# Patient Record
Sex: Male | Born: 1970 | Race: White | Hispanic: No | Marital: Married | Smoking: Never smoker
Health system: Southern US, Community
[De-identification: ages and names within clinical notes are randomized; demographics above are authoritative.]

## PROBLEM LIST (undated history)

## (undated) DIAGNOSIS — T7840XA Allergy, unspecified, initial encounter: Secondary | ICD-10-CM

## (undated) DIAGNOSIS — M1990 Unspecified osteoarthritis, unspecified site: Secondary | ICD-10-CM

## (undated) DIAGNOSIS — K219 Gastro-esophageal reflux disease without esophagitis: Secondary | ICD-10-CM

## (undated) HISTORY — DX: Allergy, unspecified, initial encounter: T78.40XA

## (undated) HISTORY — DX: Gastro-esophageal reflux disease without esophagitis: K21.9

## (undated) HISTORY — PX: OTHER SURGICAL HISTORY: SHX169

## (undated) HISTORY — DX: Unspecified osteoarthritis, unspecified site: M19.90

## (undated) HISTORY — PX: HERNIA REPAIR: SHX51

---

## 2013-08-22 ENCOUNTER — Encounter (HOSPITAL_COMMUNITY): Payer: Self-pay | Admitting: Emergency Medicine

## 2013-08-22 ENCOUNTER — Emergency Department (INDEPENDENT_AMBULATORY_CARE_PROVIDER_SITE_OTHER)
Admission: EM | Admit: 2013-08-22 | Discharge: 2013-08-22 | Disposition: A | Discharge status: Home / Self Care | Payer: Self-pay | Source: Home / Self Care | Attending: Family Medicine | Admitting: Family Medicine

## 2013-08-22 ENCOUNTER — Emergency Department (INDEPENDENT_AMBULATORY_CARE_PROVIDER_SITE_OTHER): Payer: Self-pay

## 2013-08-22 DIAGNOSIS — M19041 Primary osteoarthritis, right hand: Secondary | ICD-10-CM

## 2013-08-22 DIAGNOSIS — M19049 Primary osteoarthritis, unspecified hand: Secondary | ICD-10-CM

## 2013-08-22 DIAGNOSIS — M79609 Pain in unspecified limb: Secondary | ICD-10-CM

## 2013-08-22 DIAGNOSIS — M79641 Pain in right hand: Secondary | ICD-10-CM

## 2013-08-22 MED ORDER — METHYLPREDNISOLONE ACETATE 80 MG/ML IJ SUSP
INTRAMUSCULAR | Status: AC
  Filled 2013-08-22: qty 1

## 2013-08-22 MED ORDER — METHYLPREDNISOLONE ACETATE 40 MG/ML IJ SUSP
80.0000 mg | Freq: Once | INTRAMUSCULAR | Status: AC
  Administered 2013-08-22: 80 mg via INTRAMUSCULAR

## 2013-08-22 MED ORDER — TRIAMCINOLONE ACETONIDE 40 MG/ML IJ SUSP
INTRAMUSCULAR | Status: AC
  Filled 2013-08-22: qty 1

## 2013-08-22 MED ORDER — TRIAMCINOLONE ACETONIDE 40 MG/ML IJ SUSP
40.0000 mg | Freq: Once | INTRAMUSCULAR | Status: AC
  Administered 2013-08-22: 40 mg via INTRAMUSCULAR

## 2013-08-22 MED ORDER — MELOXICAM 15 MG PO TABS: 15.0000 mg | ORAL_TABLET | Freq: Every day | ORAL | Status: DC

## 2013-08-22 NOTE — ED Notes (Signed)
Pt reports pain on right hand onset 2 months Sx include swelling around MCP's and unable to make a fist due to pain Works in [employer] business; denies inj/truama Alert w/no signs of acute distress.

## 2013-08-22 NOTE — ED Provider Notes (Signed)
CSN: [id number]     Arrival date & time 08/22/13  [id number] History   First MD Initiated Contact with Patient 08/22/13 1020     Chief Complaint  Patient presents with  . Hand Pain   (Consider location/radiation/quality/duration/timing/severity/associated sxs/prior Treatment) HPI Comments: 43 year old male presents complaining of right hand pain. This pain initially began about 2 months ago and has gotten progressively worse. It has gotten much worse over the past week and has been associated with swelling around third  MCP joint. the pain is increased with any gripping activities, and he feels like he is having significant difficulty gripping with his third finger. 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 He denies any specific injuries. There is no numbness in the hand, or pain anywhere else. He has a family history of osteoarthritis.   History reviewed. No pertinent past medical history. History reviewed. No pertinent past surgical history. No family history on file. History  Substance Use Topics  . Smoking status: Never Smoker   . Smokeless tobacco: Not on file  . Alcohol Use: Yes    Review of Systems  Musculoskeletal:       Right hand pain   All other systems reviewed and are negative.   Allergies  Review of patient's allergies indicates no known allergies.  Home Medications   Prior to Admission medications   Medication Sig Start Date End Date Taking? Authorizing Provider  meloxicam (MOBIC) 15 MG tablet Take 1 tablet (15 mg total) by mouth daily. 08/22/13   Adrian BlackwaterZachary H Christinea Brizuela, PA-C   BP 178/105  Pulse 71  Temp(Src) 99.2 F (37.3 C) (Oral)  Resp 18  SpO2 99% Physical Exam  Nursing note and vitals reviewed. Constitutional: He is oriented to person, place, and time. He appears well-developed and well-nourished. No distress.  HENT:  Head: Normocephalic.   Cardiovascular:  Pulses:      Radial pulses are 2+ on the right side, and 2+ on the left side.  Pulmonary/Chest: Effort normal. No respiratory distress.  Musculoskeletal:       Right hand: He exhibits tenderness (minimal tenderness over distal third metacarpal), bony tenderness and swelling (Around the distal third metacarpal). He exhibits normal range of motion, normal two-point discrimination, normal capillary refill, no deformity and no laceration. Normal sensation noted. Normal strength noted.  Neurological: He is alert and oriented to person, place, and time. Coordination normal.  Skin: Skin is warm and dry. No rash noted. He is not diaphoretic.  Psychiatric: He has a normal mood and affect. Judgment normal.    ED Course  Procedures (including critical care time) Labs Review Labs Reviewed - No data to display  Imaging Review Dg Hand Complete Right  08/22/2013   CLINICAL DATA:  Hand pain for several weeks. History of repetitive motion, but no acute injury.  EXAM: RIGHT HAND - COMPLETE 3+ VIEW  COMPARISON:  None.  FINDINGS: There is no evidence of fracture or dislocation. There is no evidence of arthropathy or soft tissue swelling. Focal degenerative change at the fourth finger PIP joint with joint space narrowing and osseous spurring. This could represent an old injury.  IMPRESSION: No acute findings.  Fourth digit PIP joint degenerative change.   Electronically Signed   By: Davonna BellingJohn  Curnes M.D.   On: 08/22/2013 10:54     MDM   1. Pain of right hand   2.  Arthritis of right hand    X-rays negative. Given his level of activity with the hand, likely has some arthritis. Will give injection of steroids here and discharge with daily meloxicam, ice, and relative rest. Followup as needed   Meds ordered this encounter  Medications  . methylPREDNISolone acetate (DEPO-MEDROL) injection 80 mg    Sig:   . triamcinolone acetonide (KENALOG-40) injection 40 mg    Sig:   . meloxicam (MOBIC) 15  MG tablet    Sig: Take 1 tablet (15 mg total) by mouth daily.    Dispense:  30 tablet    Refill:  2    Order Specific Question:  Supervising Provider    Answer:  Bradd Canary D [[id number]]       Graylon Good, PA-C 08/22/13 1108

## 2013-08-22 NOTE — Discharge Instructions (Signed)
Arthritis, Nonspecific °Arthritis is pain, redness, warmth, or puffiness (inflammation) of a joint. The joint may be stiff or hurt when you move it. One or more joints may be affected. There are many types of arthritis. Your doctor may not know what type you have right away. The most common cause of arthritis is wear and tear on the joint (osteoarthritis). °HOME CARE  °· Only take medicine as told by your doctor. °· Rest the joint as much as possible. °· Raise (elevate) your joint if it is puffy. °· Use crutches if the painful joint is in your leg. °· Drink enough fluids to keep your pee (urine) clear or pale yellow. °· Follow your doctor's diet instructions. °· Use cold packs for very bad joint pain for 10 to 15 minutes every hour. Ask your doctor if it is okay for you to use hot packs. °· Exercise as told by your doctor. °· Take a warm shower if you have stiffness in the morning. °· Move your sore joints throughout the day. °GET HELP RIGHT AWAY IF:  °· You have a fever. °· You have very bad joint pain, puffiness, or redness. °· You have many joints that are painful and puffy. °· You are not getting better with treatment. °· You have very bad back pain or leg weakness. °· You cannot control when you poop (bowel movement) or pee (urinate). °· You do not feel better in 24 hours or are getting worse. °· You are having side effects from your medicine. °MAKE SURE YOU:  °· Understand these instructions. °· Will watch your condition. °· Will get help right away if you are not doing well or get worse. °Document Released: 03/21/2009 Document Revised: 06/26/2011 Document Reviewed: 03/21/2009 °ExitCare® Patient Information ©2015 [employer]. This information is not intended to replace advice given to you by your health care provider. Make sure you discuss any questions you have with your health care provider. ° °

## 2013-08-22 NOTE — ED Provider Notes (Signed)
Medical screening examination/treatment/procedure(s) were performed by resident physician or non-physician practitioner and as supervising physician I was immediately available for consultation/collaboration.   Rayah Fines DOUGLAS MD.   Semya Klinke D Cyriah Childrey, MD 08/22/13 1245 

## 2015-05-12 IMAGING — CR DG HAND COMPLETE 3+V*R*
3 series · 3 of 3 positions shown · non-contrast
Comparison: None.

CLINICAL DATA: Hand pain for several weeks. History of repetitive
motion, but no acute injury.

EXAM:
RIGHT HAND - COMPLETE 3+ VIEW

[view not recorded (1 of 3)]
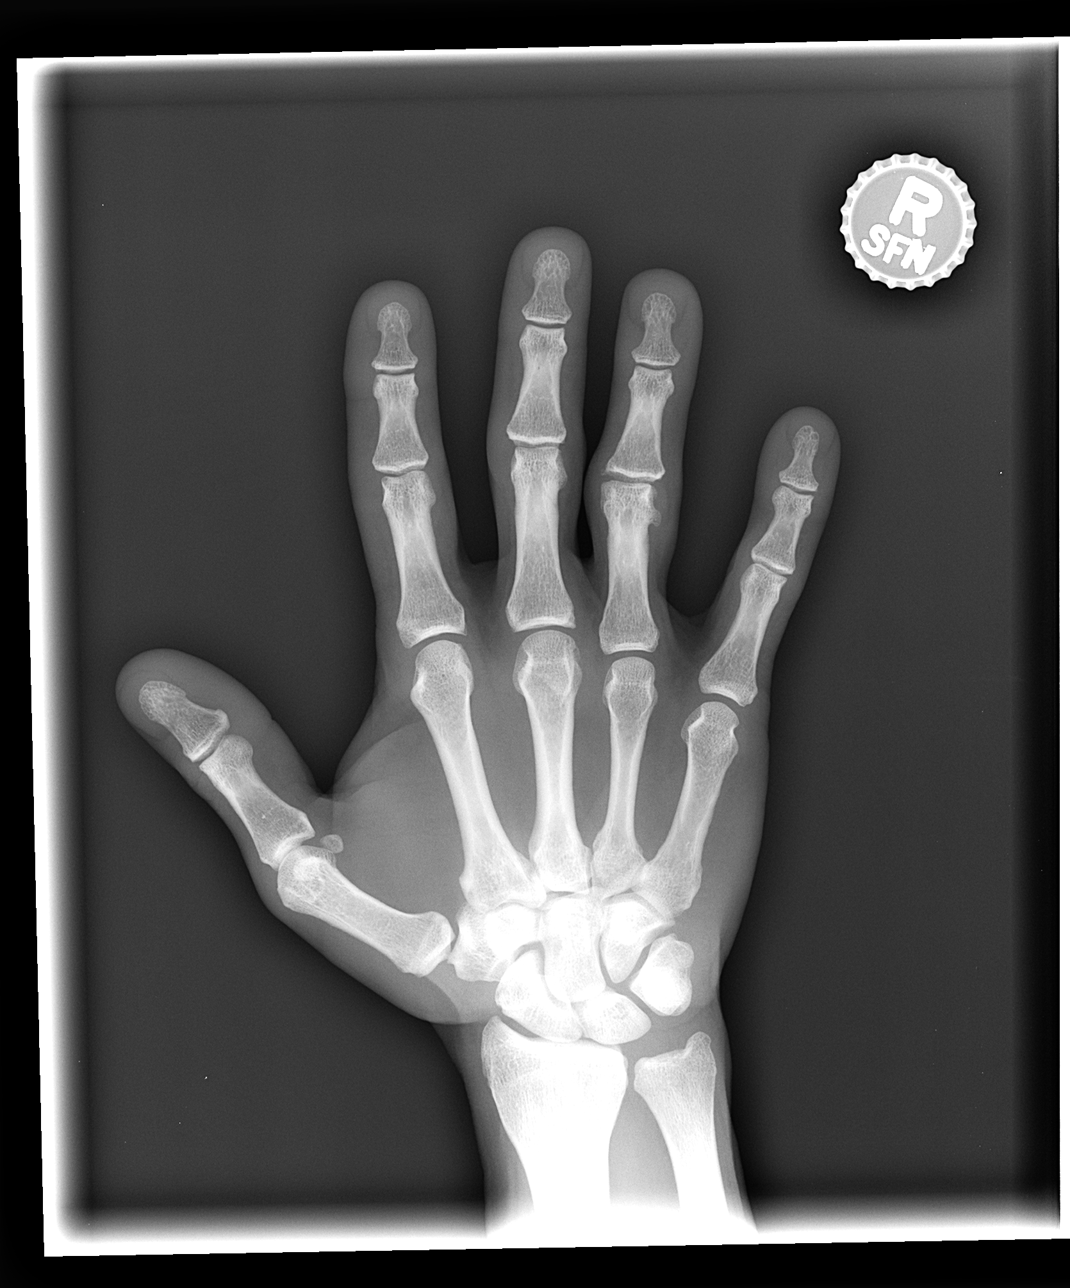

[view not recorded (2 of 3)]
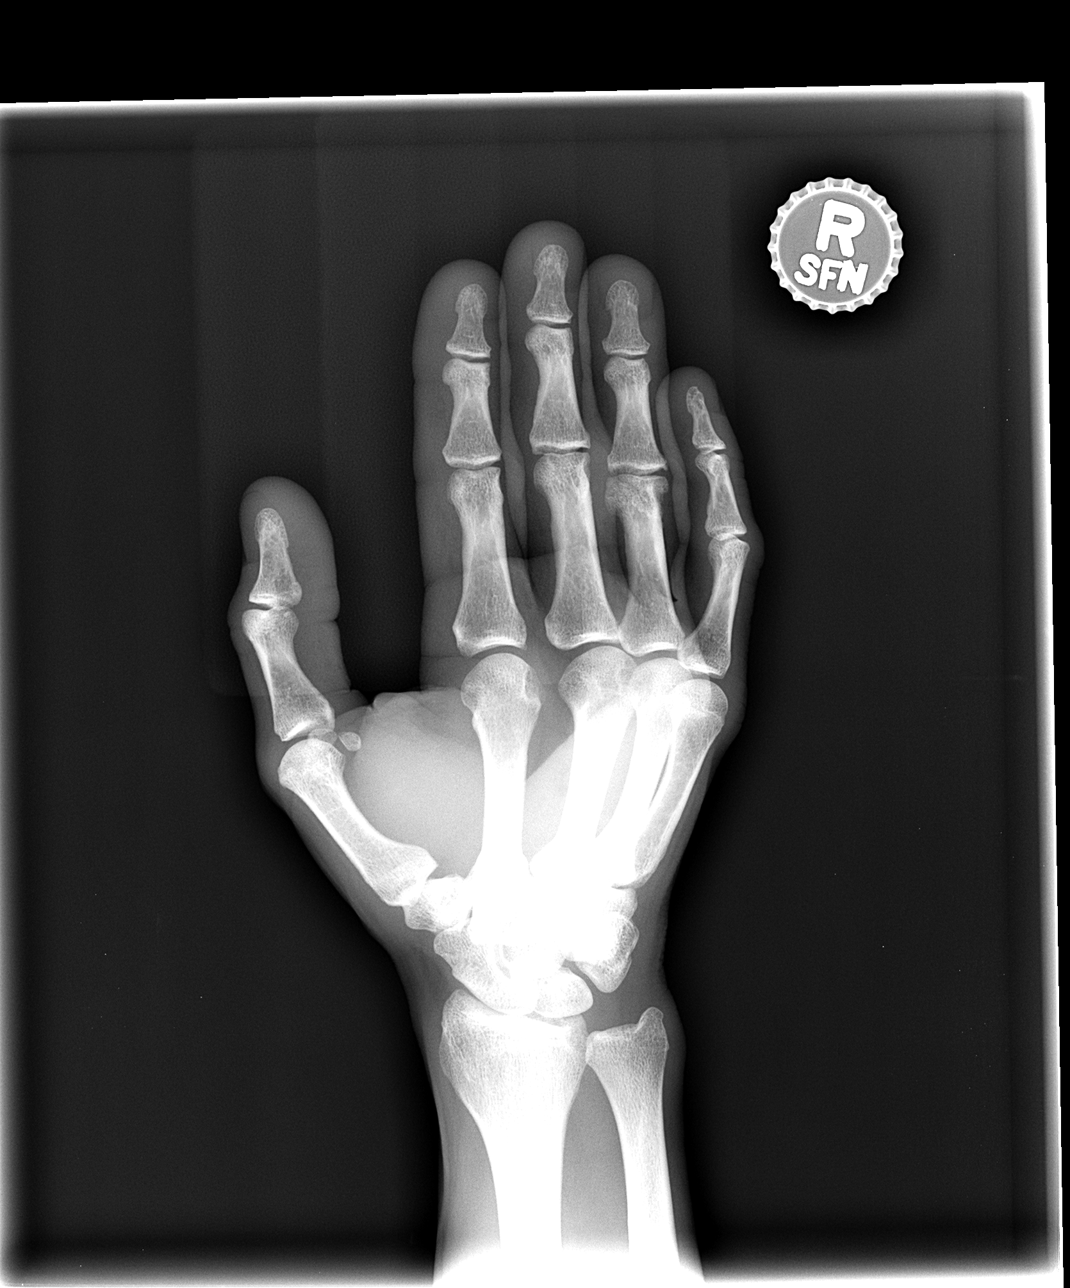

[view not recorded (3 of 3)]
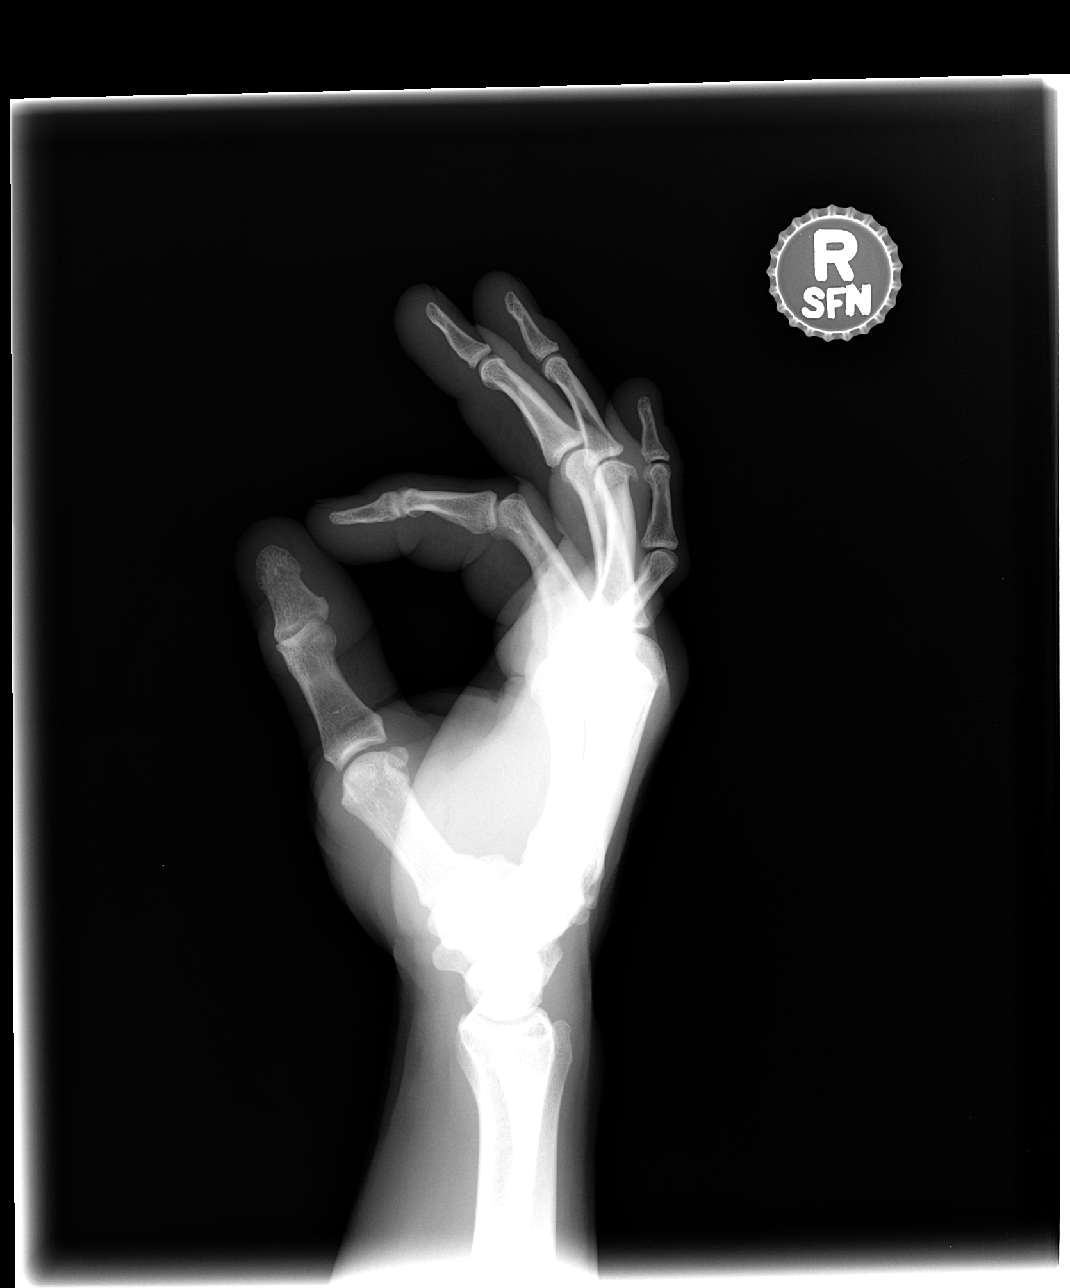

[3 of 3 positions shown; findings below may reference images not displayed]

FINDINGS: There is no evidence of fracture or dislocation. There is no
evidence of arthropathy or soft tissue swelling. Focal degenerative
change at the fourth finger PIP joint with joint space narrowing and
osseous spurring. This could represent an old injury.
IMPRESSION: No acute findings.  Fourth digit PIP joint degenerative change.

## 2019-06-20 ENCOUNTER — Ambulatory Visit: Payer: Self-pay | Attending: Internal Medicine

## 2019-06-20 DIAGNOSIS — Z23 Encounter for immunization: Secondary | ICD-10-CM

## 2019-06-20 NOTE — Progress Notes (Signed)
   Covid-19 Vaccination Clinic  Name:  Patrick Mills    MRN: [id number] DOB: Mar 19, 1970  06/20/2019  [name] was observed post Covid-19 immunization for 15 minutes without incident. He was provided with Vaccine Information Sheet and instruction to access the V-Safe system.   [name] was instructed to call 911 with any severe reactions post vaccine: Marland Kitchen Difficulty breathing  . Swelling of face and throat  . A fast heartbeat  . A bad rash all over body  . Dizziness and weakness   Immunizations Administered    Name Date Dose VIS Date Route   Pfizer COVID-19 Vaccine 06/20/2019 12:03 PM 0.3 mL 03/04/2018 Intramuscular   Manufacturer: [employer], [employer]   Lot: [id number]   NDC: [address]-0

## 2022-10-26 ENCOUNTER — Emergency Department (HOSPITAL_COMMUNITY): Payer: Self-pay

## 2022-10-26 ENCOUNTER — Other Ambulatory Visit: Payer: Self-pay

## 2022-10-26 ENCOUNTER — Encounter (HOSPITAL_COMMUNITY): Payer: Self-pay

## 2022-10-26 ENCOUNTER — Inpatient Hospital Stay (HOSPITAL_COMMUNITY)
Admission: EM | Admit: 2022-10-26 | Discharge: 2022-10-28 | DRG: 872 | Disposition: A | Discharge status: Home / Self Care | Payer: Self-pay | Attending: Family Medicine | Admitting: Family Medicine

## 2022-10-26 DIAGNOSIS — Z1152 Encounter for screening for COVID-19: Secondary | ICD-10-CM

## 2022-10-26 DIAGNOSIS — I447 Left bundle-branch block, unspecified: Secondary | ICD-10-CM | POA: Diagnosis present

## 2022-10-26 DIAGNOSIS — I10 Essential (primary) hypertension: Secondary | ICD-10-CM | POA: Insufficient documentation

## 2022-10-26 DIAGNOSIS — A419 Sepsis, unspecified organism: Principal | ICD-10-CM

## 2022-10-26 DIAGNOSIS — Z789 Other specified health status: Secondary | ICD-10-CM

## 2022-10-26 DIAGNOSIS — R17 Unspecified jaundice: Secondary | ICD-10-CM

## 2022-10-26 DIAGNOSIS — K5732 Diverticulitis of large intestine without perforation or abscess without bleeding: Secondary | ICD-10-CM | POA: Diagnosis present

## 2022-10-26 DIAGNOSIS — M1990 Unspecified osteoarthritis, unspecified site: Secondary | ICD-10-CM | POA: Diagnosis present

## 2022-10-26 DIAGNOSIS — F1090 Alcohol use, unspecified, uncomplicated: Secondary | ICD-10-CM

## 2022-10-26 DIAGNOSIS — K5792 Diverticulitis of intestine, part unspecified, without perforation or abscess without bleeding: Principal | ICD-10-CM

## 2022-10-26 DIAGNOSIS — Z886 Allergy status to analgesic agent status: Secondary | ICD-10-CM

## 2022-10-26 DIAGNOSIS — Z833 Family history of diabetes mellitus: Secondary | ICD-10-CM

## 2022-10-26 DIAGNOSIS — E860 Dehydration: Secondary | ICD-10-CM | POA: Insufficient documentation

## 2022-10-26 DIAGNOSIS — Z800 Family history of malignant neoplasm of digestive organs: Secondary | ICD-10-CM

## 2022-10-26 DIAGNOSIS — R6520 Severe sepsis without septic shock: Secondary | ICD-10-CM | POA: Diagnosis present

## 2022-10-26 DIAGNOSIS — E872 Acidosis, unspecified: Secondary | ICD-10-CM | POA: Diagnosis present

## 2022-10-26 HISTORY — DX: Other specified health status: Z78.9

## 2022-10-26 HISTORY — DX: Diverticulitis of large intestine without perforation or abscess without bleeding: K57.32

## 2022-10-26 HISTORY — DX: Alcohol use, unspecified, uncomplicated: F10.90

## 2022-10-26 LAB — URINALYSIS, ROUTINE W REFLEX MICROSCOPIC
Bilirubin Urine: NEGATIVE
Glucose, UA: NEGATIVE mg/dL
Hgb urine dipstick: NEGATIVE
Ketones, ur: 5 mg/dL — AB
Leukocytes,Ua: NEGATIVE
Nitrite: NEGATIVE
Protein, ur: NEGATIVE mg/dL
Specific Gravity, Urine: 1.042 — ABNORMAL HIGH (ref 1.005–1.030)
pH: 6 (ref 5.0–8.0)

## 2022-10-26 LAB — CBC
HCT: 44.9 % (ref 39.0–52.0)
Hemoglobin: 16 g/dL (ref 13.0–17.0)
MCH: 35 pg — ABNORMAL HIGH (ref 26.0–34.0)
MCHC: 35.6 g/dL (ref 30.0–36.0)
MCV: 98.2 fL (ref 80.0–100.0)
Platelets: 230 10*3/uL (ref 150–400)
RBC: 4.57 MIL/uL (ref 4.22–5.81)
RDW: 11.8 % (ref 11.5–15.5)
WBC: 18.6 10*3/uL — ABNORMAL HIGH (ref 4.0–10.5)
nRBC: 0 % (ref 0.0–0.2)

## 2022-10-26 LAB — COMPREHENSIVE METABOLIC PANEL
ALT: 46 U/L — ABNORMAL HIGH (ref 0–44)
AST: 46 U/L — ABNORMAL HIGH (ref 15–41)
Albumin: 4.8 g/dL (ref 3.5–5.0)
Alkaline Phosphatase: 63 U/L (ref 38–126)
Anion gap: 17 — ABNORMAL HIGH (ref 5–15)
BUN: 12 mg/dL (ref 6–20)
CO2: 20 mmol/L — ABNORMAL LOW (ref 22–32)
Calcium: 10.1 mg/dL (ref 8.9–10.3)
Chloride: 102 mmol/L (ref 98–111)
Creatinine, Ser: 1 mg/dL (ref 0.61–1.24)
GFR, Estimated: >60 mL/min (ref >60)
Glucose, Bld: 157 mg/dL — ABNORMAL HIGH (ref 70–99)
Potassium: 4.3 mmol/L (ref 3.5–5.1)
Sodium: 139 mmol/L (ref 135–145)
Total Bilirubin: 1.5 mg/dL — ABNORMAL HIGH (ref 0.3–1.2)
Total Protein: 7.9 g/dL (ref 6.5–8.1)

## 2022-10-26 LAB — LIPASE, BLOOD: Lipase: 23 U/L (ref 11–51)

## 2022-10-26 LAB — HIV ANTIBODY (ROUTINE TESTING W REFLEX): HIV Screen 4th Generation wRfx: NONREACTIVE

## 2022-10-26 LAB — PROTIME-INR
INR: 0.9 (ref 0.8–1.2)
Prothrombin Time: 12.8 s (ref 11.4–15.2)

## 2022-10-26 LAB — RESP PANEL BY RT-PCR (RSV, FLU A&B, COVID)  RVPGX2
Influenza A by PCR: NEGATIVE
Influenza B by PCR: NEGATIVE
Resp Syncytial Virus by PCR: NEGATIVE
SARS Coronavirus 2 by RT PCR: NEGATIVE

## 2022-10-26 LAB — I-STAT CG4 LACTIC ACID, ED
Lactic Acid, Venous: 5.2 mmol/L (ref 0.5–1.9)
Lactic Acid, Venous: 1.8 mmol/L (ref 0.5–1.9)

## 2022-10-26 MED ORDER — IOHEXOL 350 MG/ML SOLN
75.0000 mL | Freq: Once | INTRAVENOUS | Status: AC | PRN
  Administered 2022-10-26: 75 mL via INTRAVENOUS

## 2022-10-26 MED ORDER — ONDANSETRON HCL 4 MG/2ML IJ SOLN
4.0000 mg | Freq: Once | INTRAMUSCULAR | Status: AC
  Administered 2022-10-26: 4 mg via INTRAVENOUS
  Filled 2022-10-26: qty 2

## 2022-10-26 MED ORDER — HYDROMORPHONE HCL 1 MG/ML IJ SOLN
1.0000 mg | Freq: Once | INTRAMUSCULAR | Status: AC
  Administered 2022-10-26: 1 mg via INTRAVENOUS
  Filled 2022-10-26: qty 1

## 2022-10-26 MED ORDER — THIAMINE HCL 100 MG/ML IJ SOLN: 100.0000 mg | Freq: Every day | INTRAMUSCULAR | Status: DC

## 2022-10-26 MED ORDER — PIPERACILLIN-TAZOBACTAM 3.375 G IVPB
3.3750 g | Freq: Three times a day (TID) | INTRAVENOUS | Status: DC
  Administered 2022-10-26 – 2022-10-28 (×5): 3.375 g via INTRAVENOUS
  Filled 2022-10-26 (×4): qty 50

## 2022-10-26 MED ORDER — HYDROMORPHONE HCL 1 MG/ML IJ SOLN
1.0000 mg | INTRAMUSCULAR | Status: DC | PRN
  Administered 2022-10-26: 1 mg via INTRAVENOUS
  Filled 2022-10-26: qty 1

## 2022-10-26 MED ORDER — ONDANSETRON 4 MG PO TBDP
4.0000 mg | ORAL_TABLET | Freq: Three times a day (TID) | ORAL | Status: DC | PRN
  Administered 2022-10-27: 4 mg via ORAL
  Filled 2022-10-26: qty 1

## 2022-10-26 MED ORDER — ONDANSETRON HCL 4 MG/2ML IJ SOLN
4.0000 mg | Freq: Once | INTRAMUSCULAR | Status: AC | PRN
  Administered 2022-10-26: 4 mg via INTRAVENOUS
  Filled 2022-10-26: qty 2

## 2022-10-26 MED ORDER — KETOROLAC TROMETHAMINE 15 MG/ML IJ SOLN
15.0000 mg | Freq: Four times a day (QID) | INTRAMUSCULAR | Status: DC | PRN
  Administered 2022-10-26 – 2022-10-28 (×5): 15 mg via INTRAVENOUS
  Filled 2022-10-26 (×5): qty 1

## 2022-10-26 MED ORDER — THIAMINE MONONITRATE 100 MG PO TABS
100.0000 mg | ORAL_TABLET | Freq: Every day | ORAL | Status: DC
  Administered 2022-10-27 – 2022-10-28 (×2): 100 mg via ORAL
  Filled 2022-10-26 (×2): qty 1

## 2022-10-26 MED ORDER — ENOXAPARIN SODIUM 40 MG/0.4ML IJ SOSY
40.0000 mg | PREFILLED_SYRINGE | INTRAMUSCULAR | Status: DC
  Administered 2022-10-26 – 2022-10-27 (×2): 40 mg via SUBCUTANEOUS
  Filled 2022-10-26 (×2): qty 0.4

## 2022-10-26 MED ORDER — ADULT MULTIVITAMIN W/MINERALS CH
1.0000 | ORAL_TABLET | Freq: Every day | ORAL | Status: DC
  Administered 2022-10-27 – 2022-10-28 (×2): 1 via ORAL
  Filled 2022-10-26 (×2): qty 1

## 2022-10-26 MED ORDER — METRONIDAZOLE 500 MG/100ML IV SOLN
500.0000 mg | Freq: Once | INTRAVENOUS | Status: AC
  Administered 2022-10-26: 500 mg via INTRAVENOUS
  Filled 2022-10-26: qty 100

## 2022-10-26 MED ORDER — LACTATED RINGERS IV SOLN: INTRAVENOUS | Status: AC

## 2022-10-26 MED ORDER — SODIUM CHLORIDE 0.9 % IV SOLN
2.0000 g | Freq: Once | INTRAVENOUS | Status: AC
  Administered 2022-10-26: 2 g via INTRAVENOUS
  Filled 2022-10-26: qty 12.5

## 2022-10-26 MED ORDER — FOLIC ACID 1 MG PO TABS
1.0000 mg | ORAL_TABLET | Freq: Every day | ORAL | Status: DC
  Administered 2022-10-27 – 2022-10-28 (×2): 1 mg via ORAL
  Filled 2022-10-26 (×2): qty 1

## 2022-10-26 MED ORDER — ACETAMINOPHEN 10 MG/ML IV SOLN
1000.0000 mg | Freq: Four times a day (QID) | INTRAVENOUS | Status: DC
  Administered 2022-10-26 – 2022-10-27 (×3): 1000 mg via INTRAVENOUS
  Filled 2022-10-26 (×4): qty 100

## 2022-10-26 NOTE — H&P (Addendum)
Hospital Admission History and Physical Service Pager: [phone]  Patient name: Patrick Mills Medical record number: [id number] Date of Birth: 18-Jun-1970 Age: 52 y.o. Gender: male  Primary Care Provider: Patient, No Pcp Per Consultants: General surgery Code Status: FULL Preferred Emergency Contact:  Contact Information     Name Relation Home Work Mobile   Barile,Suzanne Spouse [phone]        Other Contacts   None on File    Chief Complaint: abdominal pain  Assessment and Plan: Patrick Mills is a 52 y.o. male presenting with abdominal pain . Differential for this patient's presentation of this includes diverticulitis, AAA, appendicitis, cholecystitis, pancreatitis, atypical ACS.  Diverticulitis is the leading diagnosis due to evidence of this on imaging, leukocytosis, and guarding on EDP physical exam. No evidence of appendicitis, abdominal aortic aneurysm, pancreatitis on CT scan, and pt is hemodynamically stable with normal lipase. Atypical ACS is less likely given lack of chest pain and normal EKG.  Assessment & Plan Cecal diverticulitis Patient is HDS and improved s/p fluids, pain medication, and cefepime/flagyl in the ED. Given severity of presentation, will require admission for IV antibiotics, fluids, and pain control. Surgery evaluated and recommended medical intervention for now. -- Admit to FMTS, progressive care, attending Dr. McDiarmid -- Continue IV ABX with Zosyn -- Continue LR 150mL/hr --- Remain n.p.o. other than ice chips -- IV Tylenol 400 mL/h q6h -- Toradol 15 mg q6h PRN for moderate pain -- Dilaudid 1 mg q4 PRN for severe pain -- Zofran 4 mg q8 PRN for nausea/vomiting --- AM CBC, CMP -- Monitor vitals per unit protocol -- GS recommend outpatient colonoscopy in 6 to 8 weeks Sepsis without acute organ dysfunction, due to unspecified organism [facility]) Due to diverticulitis as above. Meets sepsis criteria with tachycardia, leukocytosis, and initially  elevated lactic acid with suspected gastrointestinal source of infection. Exam and LA much improved with interventions. -- Continue IV ABX and fluids as above -- Follow-up blood cultures Alcohol use Patient reports daily alcohol use x 20 to 30 years.  Denies history of withdrawal, intubation, ICU admission. -- CIWA protocol without ativan unless needed --Thiamine, folate, multi vitamin supplementation -- TOC consult for substance use  FEN/GI: NPO, IVF 150 mL/hr VTE Prophylaxis: Lovenox  Disposition: Progressive  History of Present Illness:  Patrick Mills is a 52 y.o. male presenting with severe abdominal pain.  Yesterday, he had abdominal cramping at work. He worked through it. At 4 AM this morning, he could not hardly walk due to excruitiating pain that woke him up. He tried going to work but was unable to [employer] his shift by 8 AM due to vomiting, diarrhea, and continued worsening pain. No blood in stool or emesis. He was doubled over. He also pulled a muscle in his back last week that he feels is also contributing to lower abdominal pain.  He has not been able to eat anything today. No issues with constipation in the past; he is usually regular. He has a balanced diet. He feels dehydrated and has not drank enough recently. No known fevers, but he was having chills when he first came to the ED. Since coming into the ED, he feels better. No other recent illnesses. This has never happened before. He does not have a PCP, and he has never had a colonoscopy.  In the ED, presented with abdominal pain and was found to be peritonitic and tachycardic on physical exam.  Labs are significant for leukocytosis to 10.6 and lactic acid  to 5.2 which improved to 1.8.  CT abdomen pelvis showed cecal diverticulitis.  Got Dilaudid 1 mg for pain, Zofran 4 mg for nausea/vomiting, and was started on IV cefepime, Flagyl, fluids and made NPO.  Surgery consulted and said no need for emergent surgery at this  time.  Review Of Systems: Per HPI.  Pertinent Past Medical History: None Remainder reviewed in history tab.   Pertinent Past Surgical History: Inguinal hernia surgery on R in 1989  Remainder reviewed in history tab.   Pertinent Social History: Tobacco use: None now, <1 ppd for 15 years but quit 15-20 years ago Alcohol use: 2-4 beers every day for 20-30 years, last drink night of 10/17 at [employer] Other Substance use: smokes marijuana daily Lives with Rosalita Chessman, his wife  Pertinent Family History: Grandfather: colon cancer in mid-60s Uncle: diabetes Remainder reviewed in history tab.   Important Outpatient Medications: Takes ibuprofen prn for arthritic pain Remainder reviewed in medication history.   Objective: BP (!) 147/91   Pulse (!) 103   Temp 98.4 F (36.9 C) (Oral)   Resp (!) 120   Ht 5\' 7"  (1.702 m)   Wt 68 kg   SpO2 96%   BMI 23.49 kg/m  Exam: General: Lying in bed, conversant, in NAD Eyes: EOM grossly intact, PERRLA ENTM: Bilateral conjunctiva not injected, dry mucous membranes, good dentition Cardiovascular: Tachycardic, regular rhythm, normal S1/S2, no murmurs, rubs, gallops Respiratory: CTAB, normal WOB on RA, no wheezes, rales, rhonchi Gastrointestinal: Normoactive bowel sounds, moderate tenderness to palpation across lower abdomen to deep palpation without rebound or guarding, nondistended Derm: No obvious rashes or lesions Neuro: Alert and oriented, moving all extremities spontaneously Psych: Appropriate mood and affect  Labs:  CBC BMET  Recent Labs  Lab 10/26/22 1027  WBC 18.6*  HGB 16.0  HCT 44.9  PLT 230   Recent Labs  Lab 10/26/22 1027  NA 139  K 4.3  CL 102  CO2 20*  BUN 12  CREATININE 1.00  GLUCOSE 157*  CALCIUM 10.1    Lactic acid: 5.2 > 1.8 Lipase 23  EKG: Normal sinus rhythm, no ST elevations or LBBB  Imaging Studies Performed:  CTAP 1. Findings are most consistent with cecal diverticulitis. There  is surrounding inflammation and a small amount of fluid in the right pericolic gutter, but no evidence of bowel perforation or abscess. The appendix and terminal ileum appear normal. 2. No other acute findings. 3.  Aortic Atherosclerosis (ICD10-I70.0).  Lorayne Bender, MD 10/26/2022, 2:19 PM PGY-1, [facility] FPTS Intern pager: [phone], text pages welcome Secure chat group [facility] Teaching Service   I was personally present and performed or re-performed the history, physical exam and medical decision making activities of this service and have verified that the service and findings are accurately documented in the resident's note.  Janeal Holmes, MD                  10/26/2022, 3:59 PM PGY-2, [facility]

## 2022-10-26 NOTE — Plan of Care (Signed)
CHL Tonsillectomy/Adenoidectomy, Postoperative PEDS care plan entered in error.

## 2022-10-26 NOTE — Assessment & Plan Note (Addendum)
Due to diverticulitis as above. Meets sepsis criteria with tachycardia, leukocytosis, and initially elevated lactic acid with suspected gastrointestinal source of infection. Exam and LA much improved with interventions. -- Continue IV ABX and fluids as above -- Follow-up blood cultures

## 2022-10-26 NOTE — Consult Note (Signed)
[facility] Mills 03-May-1970  [id number].    Requesting MD: Dr. Derwood Kaplan Chief Complaint/Reason for Consult: RLQ abdominal pain  HPI:  This is a 52 yo otherwise healthy male who began having some crampy abdominal pain yesterday throughout the day.  He was able to tolerate a diet yesterday, but then developed some nausea and vomiting.  He has subsequent developed diarrhea as well.  This was nonbloody as was his emesis.  No further diarrhea since getting to the hospital.  This morning around 0400am he developed acutely worsening pain on the right side of his lower abdomen.  He denies any fevers.  His pain was so significant that his wife brought him to the [facility].  He has been found to have a lactic acid of 5.2, WBC of 18K, but AF, borderline tachy at 100, and a CT scan showing cecal diverticulitis with no complications such as perforation, abscess, etc.  The appendix and TI are normal.  The patient has never had a colonoscopy.  We have been asked to see him.  ROS: ROS: Please see HPI, otherwise negative, except he has only voided once today.  Denies pneumaturia, dysuria, SOB, CP, etc  History reviewed. No pertinent family history.  History reviewed. No pertinent past medical history.  Past Surgical History:  Procedure Laterality Date   right inguinal hernia repair      Social History:  reports that he has never smoked. He has never used smokeless tobacco. He reports current alcohol use of about 28.0 standard drinks of alcohol per week. He reports current drug use. Drug: Marijuana.  Allergies:  Allergies  Allergen Reactions   Meloxicam Hives    (Not in a hospital admission)    Physical Exam: Blood pressure (!) 147/91, pulse (!) 103, temperature 98.4 F (36.9 C), temperature source Oral, resp. rate (!) 120, height 5\' 7"  (1.702 m), weight 68 kg, SpO2 96%. General: pleasant, WD, WN white male who is laying in bed in NAD HEENT: head is normocephalic, atraumatic.  Sclera are  noninjected.  PERRL.  Ears and nose without any masses or lesions.  Mouth is pink and moist Heart: regular, rate, and rhythm.  Normal s1,s2. No obvious murmurs, gallops, or rubs noted.  Palpable radial and pedal pulses bilaterally Lungs: CTAB, no wheezes, rhonchi, or rales noted.  Respiratory effort nonlabored Abd: soft, tender on the right side of the abdomen, greatest focally in the RLQ with mild guarding, but no peritonitis, ND, +BS, no masses or organomegaly, very tiny reducible umbilical hernia. MS: all 4 extremities are symmetrical with no cyanosis, clubbing, or edema. Psych: A&Ox3 with an appropriate affect.   Results for orders placed or performed during the hospital encounter of 10/26/22 (from the past 48 hour(s))  Lipase, blood     Status: None   Collection Time: 10/26/22 10:27 AM  Result Value Ref Range   Lipase 23 11 - 51 U/L    Comment: Performed at [facility], [address] [place], [place] [address]  Comprehensive metabolic panel     Status: Abnormal   Collection Time: 10/26/22 10:27 AM  Result Value Ref Range   Sodium 139 135 - 145 mmol/L   Potassium 4.3 3.5 - 5.1 mmol/L   Chloride 102 98 - 111 mmol/L   CO2 20 (L) 22 - 32 mmol/L   Glucose, Bld 157 (H) 70 - 99 mg/dL    Comment: Glucose reference range applies only to samples taken after fasting for at least 8 hours.  BUN 12 6 - 20 mg/dL   Creatinine, Ser 1.61 0.61 - 1.24 mg/dL   Calcium 09.6 8.9 - 04.5 mg/dL   Total Protein 7.9 6.5 - 8.1 g/dL   Albumin 4.8 3.5 - 5.0 g/dL   AST 46 (H) 15 - 41 U/L   ALT 46 (H) 0 - 44 U/L   Alkaline Phosphatase 63 38 - 126 U/L   Total Bilirubin 1.5 (H) 0.3 - 1.2 mg/dL   GFR, Estimated >40 >98 mL/min    Comment: (NOTE) Calculated using the CKD-EPI Creatinine Equation (2021)    Anion gap 17 (H) 5 - 15    Comment: Performed at [facility], [address], [place], [place] [address]  CBC     Status: Abnormal   Collection Time: 10/26/22 10:27 AM  Result Value Ref Range    WBC 18.6 (H) 4.0 - 10.5 K/uL   RBC 4.57 4.22 - 5.81 MIL/uL   Hemoglobin 16.0 13.0 - 17.0 g/dL   HCT 78.2 95.6 - 21.3 %   MCV 98.2 80.0 - 100.0 fL   MCH 35.0 (H) 26.0 - 34.0 pg   MCHC 35.6 30.0 - 36.0 g/dL   RDW 08.6 57.8 - 46.9 %   Platelets 230 150 - 400 K/uL   nRBC 0.0 0.0 - 0.2 %    Comment: Performed at [facility], [address], [place], [place] [address]  I-Stat Lactic Acid, ED     Status: Abnormal   Collection Time: 10/26/22  1:25 PM  Result Value Ref Range   Lactic Acid, Venous 5.2 (HH) 0.5 - 1.9 mmol/L   Comment NOTIFIED PHYSICIAN   Protime-INR     Status: None   Collection Time: 10/26/22  1:25 PM  Result Value Ref Range   Prothrombin Time 12.8 11.4 - 15.2 seconds   INR 0.9 0.8 - 1.2    Comment: (NOTE) INR goal varies based on device and disease states. Performed at [facility], [address], [place], [place] [address]   I-Stat Lactic Acid, ED     Status: None   Collection Time: 10/26/22  1:50 PM  Result Value Ref Range   Lactic Acid, Venous 1.8 0.5 - 1.9 mmol/L   CT ABDOMEN PELVIS W CONTRAST  Result Date: 10/26/2022 CLINICAL DATA:  Abdominal pain. Peritonitis or perforation suspected. EXAM: CT ABDOMEN AND PELVIS WITH CONTRAST TECHNIQUE: Multidetector CT imaging of the abdomen and pelvis was performed using the standard protocol following bolus administration of intravenous contrast. RADIATION DOSE REDUCTION: This exam was performed according to the departmental dose-optimization program which includes automated exposure control, adjustment of the mA and/or kV according to patient size and/or use of iterative reconstruction technique. CONTRAST:  75mL OMNIPAQUE IOHEXOL 350 MG/ML SOLN COMPARISON:  None Available. FINDINGS: Lower chest: Clear lung bases. No significant pleural or pericardial effusion. Hepatobiliary: The liver is normal in density without suspicious focal abnormality. No evidence of gallstones, gallbladder wall thickening or biliary  dilatation. Pancreas: Unremarkable. No pancreatic ductal dilatation or surrounding inflammatory changes. Spleen: Normal in size without focal abnormality. Adrenals/Urinary Tract: Both adrenal glands appear normal. No evidence of urinary tract calculus, suspicious renal lesion or hydronephrosis. The bladder appears unremarkable for its degree of distention. Stomach/Bowel: No enteric contrast administered. The stomach appears unremarkable for its degree of distention. No small bowel distension, wall thickening or surrounding inflammation identified. The terminal ileum and appendix appear normal. However, there is wall thickening of the medial cecum with surrounding inflammation and a  probable inflamed diverticulum (axial images 48 and 49 of series 3), consistent with diverticulitis. There is a small amount of fluid in the right pericolic gutter, but no focal extraluminal fluid collection or extraluminal gas. More distally, the colon is decompressed without additional inflammation. Mild sigmoid diverticulosis. Vascular/Lymphatic: There are no enlarged abdominal or pelvic lymph nodes. No acute vascular findings. Mild aortoiliac atherosclerosis. 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 Reproductive: The prostate gland and seminal vesicles appear unremarkable. Other: No evidence of abdominal wall mass or hernia. No ascites or pneumoperitoneum. Musculoskeletal: No acute or significant osseous findings. Unless specific follow-up recommendations are mentioned in the findings or impression sections, no imaging follow-up of any mentioned incidental findings is recommended. IMPRESSION: 1. Findings are most consistent with cecal diverticulitis. There is surrounding inflammation and a small amount of fluid in the right pericolic gutter, but no evidence of bowel perforation or abscess. The appendix and terminal ileum appear normal. 2. No other acute findings. 3.  Aortic Atherosclerosis (ICD10-I70.0). Electronically  Signed   By: Carey Bullocks M.D.   On: 10/26/2022 13:47      Assessment/Plan Cecal diverticulitis The patient has been seen, examined, labs, vitals, imaging, and chart reviewed.  I have personally discussed this case with the EDP as well as on the phone with radiology.  The patient's appendix appears normal. His cecum is diffusely inflamed with evidence of diverticulum, suggesting cecal diverticulitis.  There is a small amount of pericolic gutter fluid/stranding, but no evidence of perforation or abscess.  The patient's lactic acid is 5.2 and suspect this is more secondary to dehydration as opposed to sepsis given he has only voided once and how his CT scan appears.  He does not need an emergency operation.  He has essentially uncomplicated diverticulitis.  We would recommend medical admission with IV abx therapy and bowel rest.  He will need outpatient GI follow up for a colonoscopy in 6-8 weeks.  We did discuss that if he acutely worsens during his stay that could result in repeat imaging vs even the need for surgery if he were to acutely worsen.  He and his wife, who is at bedside, understand.  All questions were answered.  I did discuss pathophysiology of diverticulitis with them.   FEN - NPO/IVF resuscitation VTE - ok for chemical prophylaxis from our standpoint ID - currently getting cefepime/flagyl x1 dose in ED, could continue this vs Rocephin/Flagyl given first episode and uncomplicated nature, vs zosyn as well Admit - per medicine  Daily marijuana  Daily ETOH - 4 beers/day  I reviewed ED provider notes, last 24 h vitals and pain scores, last 48 h intake and output, last 24 h labs and trends, and last 24 h imaging results d/w with EDP in person and radiology on the phone.  Letha Cape, [facility] Surgery 10/26/2022, 2:20 PM Please see Amion for pager number during day hours 7:00am-4:30pm or 7:00am -11:30am on weekends

## 2022-10-26 NOTE — ED Notes (Signed)
This Clinical research associate did not see orders for blood cultures until after initiating antibiotics. However both antibiotics were paused in order to draw cultures prior to antibiotics being given and neither were actually administered before cultures drawn.

## 2022-10-26 NOTE — Progress Notes (Signed)
ED Pharmacy Antibiotic Sign Off An antibiotic consult was received from an ED provider for cefepime per pharmacy dosing for intra-abdominal infection. A chart review was completed to assess appropriateness.   The following one time order(s) were placed:  Cefepime 2G IV x1 Flagyl 500mg  IV x1  Further antibiotic and/or antibiotic pharmacy consults should be ordered by the admitting provider if indicated.   Thank you for allowing pharmacy to be a part of this patient's care.   Smitty Cords, [facility]  Clinical Pharmacist 10/26/22 11:36 AM

## 2022-10-26 NOTE — ED Provider Notes (Signed)
[facility] EMERGENCY DEPARTMENT AT [facility] Provider Note   CSN: [id number] Arrival date & time: 10/26/22  1012     History  Chief Complaint  Patient presents with   Abdominal Pain    Patrick Mills is a 52 y.o. male.  HPI    52 year old male comes in with chief complaint of abdominal pain.  Patient started noticing some cramping abdominal pain yesterday morning.  He felt off, but was able to focus through work, and pain was only intermittently bothering him.  However, he woke up in the middle of the night with severe abdominal pain.  Pain is located in the upper quadrants, below his ribs.  Pain is described as sharp, with tightness to his back.  Review of system also positive for nausea with emesis x 3 and 3 loose bowel movements today.  Patient has some chills and he had sweats with 1 episode of vomiting.  There was no blood or bile in the vomitus and the stool was clear, loose and nonbloody.  Patient has history of hernia repair several years ago.  He admits to regular consumption of 2-4 beers.  Home Medications Prior to Admission medications   Not on File      Allergies    Meloxicam    Review of Systems   Review of Systems  All other systems reviewed and are negative.   Physical Exam Updated Vital Signs BP (!) 147/91   Pulse (!) 103   Temp 98.4 F (36.9 C) (Oral)   Resp (!) 120   Ht 5\' 7"  (1.702 m)   Wt 68 kg   SpO2 96%   BMI 23.49 kg/m  Physical Exam Vitals and nursing note reviewed.  Constitutional:      General: He is in acute distress.     Appearance: He is well-developed.     Comments: Distress secondary to pain  HENT:     Head: Atraumatic.  Cardiovascular:     Rate and Rhythm: Normal rate.  Pulmonary:     Effort: Pulmonary effort is normal.  Abdominal:     Tenderness: There is generalized abdominal tenderness and tenderness in the right lower quadrant, suprapubic area and left lower quadrant. There is guarding and rebound. Positive  signs include McBurney's sign.  Musculoskeletal:     Cervical back: Neck supple.  Skin:    General: Skin is warm.  Neurological:     Mental Status: He is alert and oriented to person, place, and time.     ED Results / Procedures / Treatments   Labs (all labs ordered are listed, but only abnormal results are displayed) Labs Reviewed  COMPREHENSIVE METABOLIC PANEL - Abnormal; Notable for the following components:      Result Value   CO2 20 (*)    Glucose, Bld 157 (*)    AST 46 (*)    ALT 46 (*)    Total Bilirubin 1.5 (*)    Anion gap 17 (*)    All other components within normal limits  CBC - Abnormal; Notable for the following components:   WBC 18.6 (*)    MCH 35.0 (*)    All other components within normal limits  I-STAT CG4 LACTIC ACID, ED - Abnormal; Notable for the following components:   Lactic Acid, Venous 5.2 (*)    All other components within normal limits  RESP PANEL BY RT-PCR (RSV, FLU A&B, COVID)  RVPGX2  CULTURE, BLOOD (ROUTINE X 2)  CULTURE, BLOOD (ROUTINE X 2)  LIPASE, BLOOD  PROTIME-INR  URINALYSIS, ROUTINE W REFLEX MICROSCOPIC  HIV ANTIBODY (ROUTINE TESTING W REFLEX)  I-STAT CG4 LACTIC ACID, ED    EKG None  Radiology DG Chest Port 1 View  Result Date: 10/26/2022 CLINICAL DATA:  Provided history: Questionable sepsis-evaluate for abnormality. EXAM: PORTABLE CHEST 1 VIEW COMPARISON:  None. FINDINGS: Cardiac monitoring leads overlie the chest. Heart size within normal limits. No appreciable airspace consolidation. No evidence of pleural effusion or pneumothorax. No acute osseous abnormality identified. Degenerative changes of the spine. IMPRESSION: No evidence of active cardiopulmonary disease. Electronically Signed   By: Jackey Loge D.O.   On: 10/26/2022 14:33   CT ABDOMEN PELVIS W CONTRAST  Result Date: 10/26/2022 CLINICAL DATA:  Abdominal pain. Peritonitis or perforation suspected. EXAM: CT ABDOMEN AND PELVIS WITH CONTRAST TECHNIQUE: Multidetector CT  imaging of the abdomen and pelvis was performed using the standard protocol following bolus administration of intravenous contrast. RADIATION DOSE REDUCTION: This exam was performed according to the departmental dose-optimization program which includes automated exposure control, adjustment of the mA and/or kV according to patient size and/or use of iterative reconstruction technique. CONTRAST:  75mL OMNIPAQUE IOHEXOL 350 MG/ML SOLN COMPARISON:  None Available. FINDINGS: Lower chest: Clear lung bases. No significant pleural or pericardial effusion. Hepatobiliary: The liver is normal in density without suspicious focal abnormality. No evidence of gallstones, gallbladder wall thickening or biliary dilatation. Pancreas: Unremarkable. No pancreatic ductal dilatation or surrounding inflammatory changes. Spleen: Normal in size without focal abnormality. Adrenals/Urinary Tract: Both adrenal glands appear normal. No evidence of urinary tract calculus, suspicious renal lesion or hydronephrosis. The bladder appears unremarkable for its degree of distention. Stomach/Bowel: No enteric contrast administered. The stomach appears unremarkable for its degree of distention. No small bowel distension, wall thickening or surrounding inflammation identified. The terminal ileum and appendix appear normal. However, there is wall thickening of the medial cecum with surrounding inflammation and a probable inflamed diverticulum (axial images 48 and 49 of series 3), consistent with diverticulitis. There is a small amount of fluid in the right pericolic gutter, but no focal extraluminal fluid collection or extraluminal gas. More distally, the colon is decompressed without additional inflammation. Mild sigmoid diverticulosis. Vascular/Lymphatic: There are no enlarged abdominal or pelvic lymph nodes. No acute vascular findings. Mild aortoiliac atherosclerosis. 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 Reproductive: The prostate  gland and seminal vesicles appear unremarkable. Other: No evidence of abdominal wall mass or hernia. No ascites or pneumoperitoneum. Musculoskeletal: No acute or significant osseous findings. Unless specific follow-up recommendations are mentioned in the findings or impression sections, no imaging follow-up of any mentioned incidental findings is recommended. IMPRESSION: 1. Findings are most consistent with cecal diverticulitis. There is surrounding inflammation and a small amount of fluid in the right pericolic gutter, but no evidence of bowel perforation or abscess. The appendix and terminal ileum appear normal. 2. No other acute findings. 3.  Aortic Atherosclerosis (ICD10-I70.0). Electronically Signed   By: Carey Bullocks M.D.   On: 10/26/2022 13:47    Procedures Procedures    Medications Ordered in ED Medications  lactated ringers infusion ( Intravenous New Bag/Given 10/26/22 1258)  enoxaparin (LOVENOX) injection 40 mg (has no administration in time range)  ondansetron (ZOFRAN) injection 4 mg (4 mg Intravenous Given 10/26/22 1029)  ceFEPIme (MAXIPIME) 2 g in sodium chloride 0.9 % 100 mL IVPB (2 g Intravenous New Bag/Given 10/26/22 1259)  metroNIDAZOLE (FLAGYL) IVPB 500 mg (500 mg Intravenous New Bag/Given 10/26/22 1301)  HYDROmorphone (DILAUDID) injection 1 mg (1  mg Intravenous Given 10/26/22 1256)  iohexol (OMNIPAQUE) 350 MG/ML injection 75 mL (75 mLs Intravenous Contrast Given 10/26/22 1201)  ondansetron (ZOFRAN) injection 4 mg (4 mg Intravenous Given 10/26/22 1249)    ED Course/ Medical Decision Making/ A&P                                 Medical Decision Making Amount and/or Complexity of Data Reviewed Labs: ordered. Radiology: ordered. ECG/medicine tests: ordered.  Risk Prescription drug management. Decision regarding hospitalization.   This patient presents to the ED with chief complaint(s) of abdominal pain with pertinent past medical history of umbilical hernia repair,  2-4 beer consumption daily.patient appears to be having peritonitis on my exam.  The complaint involves an extensive differential diagnosis and also carries with it a high risk of complications and morbidity.    The differential diagnosis includes : Acute appendicitis, ruptured appendicitis, perforated viscus, PUD, cholecystitis, intra-abdominal abscess, AAA.  Patient denies any history of high blood pressure, cocaine use. Dissection considered, but thought to be less likely compared to other medical or surgical emergencies.  The initial plan is to get basic labs, CT abdomen pelvis with contrast.  I will start sepsis workup, as patient clearly has peritonitis based on my assessment.  Additional history obtained: Additional history obtained from spouse  Independent labs interpretation:  The following labs were independently interpreted: Elevated white count of 18.6 with mild anion gap.   Independent visualization and interpretation of imaging: - I independently visualized the following imaging with scope of interpretation limited to determining acute life threatening conditions related to emergency care: CT abdomen pelvis with contrast, which revealed/showed no evidence of free air.  I cannot visualize the appendix properly, but the right lower quadrant does have some evidence of stranding and edema of the colon.  I consulted general surgery.  And they reviewed the CT with me.  Appendix looks normal.  No free air.  They will come and see the patient given peritonitis, elevated white count.  Treatment and Reassessment: Patient reassessed.  Feels better than when he first came.  Consultation: - Consulted or discussed management/test interpretation with external professional: General Surgery, they recommend medicine admission  Final Clinical Impression(s) / ED Diagnoses Final diagnoses:  Diverticulitis  Sepsis without acute organ dysfunction, due to unspecified organism [facility])  Cecal  diverticulitis    Rx / DC Orders ED Discharge Orders     None         Derwood Kaplan, MD 10/26/22 1441

## 2022-10-26 NOTE — Assessment & Plan Note (Addendum)
Patient is HDS and improved s/p fluids, pain medication, and cefepime/flagyl in the ED. Given severity of presentation, will require admission for IV antibiotics, fluids, and pain control. Surgery evaluated and recommended medical intervention for now. -- Admit to FMTS, progressive care, attending Dr. McDiarmid -- Continue IV ABX with Zosyn -- Continue LR 172mL/hr --- Remain n.p.o. other than ice chips -- IV Tylenol 400 mL/h q6h -- Toradol 15 mg q6h PRN for moderate pain -- Dilaudid 1 mg q4 PRN for severe pain -- Zofran 4 mg q8 PRN for nausea/vomiting --- AM CBC, CMP -- Monitor vitals per unit protocol -- GS recommend outpatient colonoscopy in 6 to 8 weeks

## 2022-10-26 NOTE — Plan of Care (Signed)
FMTS Brief Progress Note  S: Patient resting comfortably in bed on evaluation this evening.  He reports that currently his pain is well-controlled but he anticipates he will need something later this evening to help with his pain so that he can sleep comfortably.    O: BP 128/89   Pulse 100   Temp 99.1 F (37.3 C) (Oral)   Resp 20   Ht 5\' 7"  (1.702 m)   Wt 68 kg   SpO2 97%   BMI 23.49 kg/m   General: Alert, oriented no obvious distress Cardiac: RRR, no M/R/G Respiratory: CTAB GI: Soft, flat.  Mild tenderness to palpation Extremities: 2+ pulses in all 4 extremities  A/P: Stable condition at this time, we will review his medication list and schedule an appropriate pain control measure.  CIWA protocol is in place, current score is 0. - Orders reviewed. Labs for AM ordered, which was adjusted as needed.  - If condition changes, plan includes escalating pain/anxiety measures.   Gerrit Heck, DO 10/26/2022, 8:21 PM PGY-1, [facility] Night Resident  Please page [phone] with questions.

## 2022-10-26 NOTE — Assessment & Plan Note (Addendum)
Patient reports daily alcohol use x 20 to 30 years.  Denies history of withdrawal, intubation, ICU admission. -- CIWA protocol without ativan unless needed --Thiamine, folate, multi vitamin supplementation -- TOC consult for substance use

## 2022-10-26 NOTE — ED Notes (Signed)
ED TO INPATIENT HANDOFF REPORT  ED Nurse Name and Phone #: Joneen Boers, Paramedic / [phone]  S Name/Age/Gender Tawny Hopping 52 y.o. male Room/Bed: 003C/003C  Code Status   Code Status: Not on file  Home/SNF/Other Home Patient oriented to: self, place, time, and situation Is this baseline? Yes   Triage Complete: Triage complete  Chief Complaint abd  pain  Triage Note No notes on file   Allergies No Known Allergies  Level of Care/Admitting Diagnosis ED Disposition     ED Disposition  Admit   Condition  --   Comment  The patient appears reasonably stabilized for admission considering the current resources, flow, and capabilities available in the ED at this time, and I doubt any other [facility] requiring further screening and/or treatment in the ED prior to admission is  present.          B Medical/Surgery History History reviewed. No pertinent past medical history. History reviewed. No pertinent surgical history.   A IV Location/Drains/Wounds Patient Lines/Drains/Airways Status     Active Line/Drains/Airways     Name Placement date Placement time Site Days   Peripheral IV 10/26/22 18 G Posterior;Right Forearm 10/26/22  1254  Forearm  less than 1            Intake/Output Last 24 hours No intake or output data in the 24 hours ending 10/26/22 1407  Labs/Imaging Results for orders placed or performed during the hospital encounter of 10/26/22 (from the past 48 hour(s))  Lipase, blood     Status: None   Collection Time: 10/26/22 10:27 AM  Result Value Ref Range   Lipase 23 11 - 51 U/L    Comment: Performed at [facility], [address] [place], [place] [address]  Comprehensive metabolic panel     Status: Abnormal   Collection Time: 10/26/22 10:27 AM  Result Value Ref Range   Sodium 139 135 - 145 mmol/L   Potassium 4.3 3.5 - 5.1 mmol/L   Chloride 102 98 - 111 mmol/L   CO2 20 (L) 22 - 32 mmol/L   Glucose, Bld 157 (H) 70 - 99 mg/dL     Comment: Glucose reference range applies only to samples taken after fasting for at least 8 hours.   BUN 12 6 - 20 mg/dL   Creatinine, Ser 2.44 0.61 - 1.24 mg/dL   Calcium 01.0 8.9 - 27.2 mg/dL   Total Protein 7.9 6.5 - 8.1 g/dL   Albumin 4.8 3.5 - 5.0 g/dL   AST 46 (H) 15 - 41 U/L   ALT 46 (H) 0 - 44 U/L   Alkaline Phosphatase 63 38 - 126 U/L   Total Bilirubin 1.5 (H) 0.3 - 1.2 mg/dL   GFR, Estimated >53 >66 mL/min    Comment: (NOTE) Calculated using the CKD-EPI Creatinine Equation (2021)    Anion gap 17 (H) 5 - 15    Comment: Performed at [facility], [address] [place], [place] [address]  CBC     Status: Abnormal   Collection Time: 10/26/22 10:27 AM  Result Value Ref Range   WBC 18.6 (H) 4.0 - 10.5 K/uL   RBC 4.57 4.22 - 5.81 MIL/uL   Hemoglobin 16.0 13.0 - 17.0 g/dL   HCT 74.2 59.5 - 63.8 %   MCV 98.2 80.0 - 100.0 fL   MCH 35.0 (H) 26.0 - 34.0 pg   MCHC 35.6 30.0 - 36.0 g/dL   RDW 75.6 43.3 - 29.5 %  Platelets 230 150 - 400 K/uL   nRBC 0.0 0.0 - 0.2 %    Comment: Performed at [facility], [address] [place], [place] [address]  I-Stat Lactic Acid, ED     Status: Abnormal   Collection Time: 10/26/22  1:25 PM  Result Value Ref Range   Lactic Acid, Venous 5.2 (HH) 0.5 - 1.9 mmol/L   Comment NOTIFIED PHYSICIAN   Protime-INR     Status: None   Collection Time: 10/26/22  1:25 PM  Result Value Ref Range   Prothrombin Time 12.8 11.4 - 15.2 seconds   INR 0.9 0.8 - 1.2    Comment: (NOTE) INR goal varies based on device and disease states. Performed at [facility], [address] [place], [place] [address]   I-Stat Lactic Acid, ED     Status: None   Collection Time: 10/26/22  1:50 PM  Result Value Ref Range   Lactic Acid, Venous 1.8 0.5 - 1.9 mmol/L   CT ABDOMEN PELVIS W CONTRAST  Result Date: 10/26/2022 CLINICAL DATA:  Abdominal pain. Peritonitis or perforation suspected. EXAM: CT ABDOMEN AND PELVIS WITH CONTRAST TECHNIQUE: Multidetector  CT imaging of the abdomen and pelvis was performed using the standard protocol following bolus administration of intravenous contrast. RADIATION DOSE REDUCTION: This exam was performed according to the departmental dose-optimization program which includes automated exposure control, adjustment of the mA and/or kV according to patient size and/or use of iterative reconstruction technique. CONTRAST:  75mL OMNIPAQUE IOHEXOL 350 MG/ML SOLN COMPARISON:  None Available. FINDINGS: Lower chest: Clear lung bases. No significant pleural or pericardial effusion. Hepatobiliary: The liver is normal in density without suspicious focal abnormality. No evidence of gallstones, gallbladder wall thickening or biliary dilatation. Pancreas: Unremarkable. No pancreatic ductal dilatation or surrounding inflammatory changes. Spleen: Normal in size without focal abnormality. Adrenals/Urinary Tract: Both adrenal glands appear normal. No evidence of urinary tract calculus, suspicious renal lesion or hydronephrosis. The bladder appears unremarkable for its degree of distention. Stomach/Bowel: No enteric contrast administered. The stomach appears unremarkable for its degree of distention. No small bowel distension, wall thickening or surrounding inflammation identified. The terminal ileum and appendix appear normal. However, there is wall thickening of the medial cecum with surrounding inflammation and a probable inflamed diverticulum (axial images 48 and 49 of series 3), consistent with diverticulitis. There is a small amount of fluid in the right pericolic gutter, but no focal extraluminal fluid collection or extraluminal gas. More distally, the colon is decompressed without additional inflammation. Mild sigmoid diverticulosis. Vascular/Lymphatic: There are no enlarged abdominal or pelvic lymph nodes. No acute vascular findings. Mild aortoiliac atherosclerosis. 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 Reproductive: The prostate  gland and seminal vesicles appear unremarkable. Other: No evidence of abdominal wall mass or hernia. No ascites or pneumoperitoneum. Musculoskeletal: No acute or significant osseous findings. Unless specific follow-up recommendations are mentioned in the findings or impression sections, no imaging follow-up of any mentioned incidental findings is recommended. IMPRESSION: 1. Findings are most consistent with cecal diverticulitis. There is surrounding inflammation and a small amount of fluid in the right pericolic gutter, but no evidence of bowel perforation or abscess. The appendix and terminal ileum appear normal. 2. No other acute findings. 3.  Aortic Atherosclerosis (ICD10-I70.0). Electronically Signed   By: Carey Bullocks M.D.   On: 10/26/2022 13:47    Pending Labs Unresulted Labs (From admission, onward)     Start     Ordered   10/26/22 1129  Resp  panel by RT-PCR (RSV, Flu A&B, Covid) Anterior Nasal Swab  (Septic presentation on arrival (screening labs, nursing and treatment orders for obvious sepsis))  Once,   URGENT        10/26/22 1129   10/26/22 1129  Blood Culture (routine x 2)  (Septic presentation on arrival (screening labs, nursing and treatment orders for obvious sepsis))  BLOOD CULTURE X 2,   STAT      10/26/22 1129   10/26/22 1023  Urinalysis, Routine w reflex microscopic -Urine, Clean Catch  Once,   URGENT       Question:  Specimen Source  Answer:  Urine, Clean Catch   10/26/22 1022            Vitals/Pain Today's Vitals   10/26/22 1021 10/26/22 1030 10/26/22 1100 10/26/22 1340  BP:  (!) 155/99  (!) 147/91  Pulse:  80  (!) 103  Resp:  (!) 21  (!) 120  Temp:    98.4 F (36.9 C)  TempSrc:    Oral  SpO2:  100%  96%  Weight: 150 lb (68 kg)     Height: 5\' 7"  (1.702 m)     PainSc: 10-Worst pain ever  3      Isolation Precautions No active isolations  Medications Medications  lactated ringers infusion ( Intravenous New Bag/Given 10/26/22 1258)  ondansetron (ZOFRAN)  injection 4 mg (4 mg Intravenous Given 10/26/22 1029)  ceFEPIme (MAXIPIME) 2 g in sodium chloride 0.9 % 100 mL IVPB (2 g Intravenous New Bag/Given 10/26/22 1259)  metroNIDAZOLE (FLAGYL) IVPB 500 mg (500 mg Intravenous New Bag/Given 10/26/22 1301)  HYDROmorphone (DILAUDID) injection 1 mg (1 mg Intravenous Given 10/26/22 1256)  iohexol (OMNIPAQUE) 350 MG/ML injection 75 mL (75 mLs Intravenous Contrast Given 10/26/22 1201)  ondansetron (ZOFRAN) injection 4 mg (4 mg Intravenous Given 10/26/22 1249)    Mobility PT walks without assistance at baseline, assistance needed at this time due to pain and weakness.     Focused Assessments     R Recommendations: See Admitting Provider Note  Report given to:   Additional Notes:

## 2022-10-26 NOTE — ED Notes (Signed)
Per mabe, md hold all oral meds except SL meds

## 2022-10-26 NOTE — Sepsis Progress Note (Signed)
Elink will follow per sepsis protocol  

## 2022-10-26 NOTE — Progress Notes (Signed)
CSW received consult for patient for substance abuse resources. CSW added substance abuse resources to patient's AVS for review.  Edwin Dada, MSW, LCSW Transitions of Care  Clinical Social Worker II [phone]

## 2022-10-26 NOTE — Hospital Course (Signed)
Santez Sollars is a 52 y.o. male who was admitted to the [facility] Medicine Teaching Service at [facility] for acute cecal diverticulitis. Hospital course is outlined below by problem.   Cecal diverticulitis Presented with severe abdominal pain and peritoneal signs per EDP.  WBC 18.6, AST and ALT 46, AG 17, lipase 23, LA 5.2>1.8.  CT abdomen pelvis with signs of acute cecal diverticulitis without evidence of perforation or abscess.  General surgery was consulted who recommended medical management.  He was admitted for IV cefepime/Flagyl > Zosyn and IV fluids with bowel rest.  He improved quickly and was transitioned to p.o. Augmentin to complete the 7-day course of antibiotics at discharge.  Alcohol use Endorsed 2-4 beers per day for 15-20 years without prior history of withdrawal.  CIWA was ordered which were 0 over admission.  He was given MVI, thiamine, folate as well as substance use resources over admission.  Issues for follow up: Ensure completion of antibiotic course Outpatient GI follow up for a colonoscopy in 6-8 weeks

## 2022-10-27 LAB — COMPREHENSIVE METABOLIC PANEL
ALT: 31 U/L (ref 0–44)
AST: 27 U/L (ref 15–41)
Albumin: 3.3 g/dL — ABNORMAL LOW (ref 3.5–5.0)
Alkaline Phosphatase: 46 U/L (ref 38–126)
Anion gap: 12 (ref 5–15)
BUN: 11 mg/dL (ref 6–20)
CO2: 23 mmol/L (ref 22–32)
Calcium: 9.3 mg/dL (ref 8.9–10.3)
Chloride: 100 mmol/L (ref 98–111)
Creatinine, Ser: 0.82 mg/dL (ref 0.61–1.24)
GFR, Estimated: >60 mL/min (ref >60)
Glucose, Bld: 109 mg/dL — ABNORMAL HIGH (ref 70–99)
Potassium: 3.5 mmol/L (ref 3.5–5.1)
Sodium: 135 mmol/L (ref 135–145)
Total Bilirubin: 1.9 mg/dL — ABNORMAL HIGH (ref 0.3–1.2)
Total Protein: 5.9 g/dL — ABNORMAL LOW (ref 6.5–8.1)

## 2022-10-27 LAB — CBC
HCT: 35.6 % — ABNORMAL LOW (ref 39.0–52.0)
Hemoglobin: 12.3 g/dL — ABNORMAL LOW (ref 13.0–17.0)
MCH: 33.7 pg (ref 26.0–34.0)
MCHC: 34.6 g/dL (ref 30.0–36.0)
MCV: 97.5 fL (ref 80.0–100.0)
Platelets: 150 10*3/uL (ref 150–400)
RBC: 3.65 MIL/uL — ABNORMAL LOW (ref 4.22–5.81)
RDW: 11.7 % (ref 11.5–15.5)
WBC: 11.3 10*3/uL — ABNORMAL HIGH (ref 4.0–10.5)
nRBC: 0 % (ref 0.0–0.2)

## 2022-10-27 MED ORDER — ACETAMINOPHEN 325 MG PO TABS
650.0000 mg | ORAL_TABLET | Freq: Four times a day (QID) | ORAL | Status: DC
  Administered 2022-10-27 – 2022-10-28 (×4): 650 mg via ORAL
  Filled 2022-10-27 (×4): qty 2

## 2022-10-27 MED ORDER — OXYCODONE HCL 5 MG PO TABS
5.0000 mg | ORAL_TABLET | Freq: Four times a day (QID) | ORAL | Status: DC | PRN
  Administered 2022-10-27: 5 mg via ORAL
  Filled 2022-10-27: qty 1

## 2022-10-27 MED ORDER — SIMETHICONE 80 MG PO CHEW: 80.0000 mg | CHEWABLE_TABLET | Freq: Four times a day (QID) | ORAL | Status: DC | PRN

## 2022-10-27 NOTE — Assessment & Plan Note (Signed)
Hx of daily alcohol use for 20-30 years. CIWA score 0 since admission. TOC added substance use resources to AVS. --continue CIWA --thiamine, folate, multivitamin

## 2022-10-27 NOTE — ED Notes (Signed)
ED TO INPATIENT HANDOFF REPORT  ED Nurse Name and Phone #: [phone] Pennie Rushing A. Ernesto Zukowski, RN  S Name/Age/Gender Patrick Mills 52 y.o. male Room/Bed: 047C/047C  Code Status   Code Status: Full Code  Home/SNF/Other Home Patient oriented to: self, place, time, and situation Is this baseline? Yes   Triage Complete: Triage complete  Chief Complaint Cecal diverticulitis [K57.32]  Triage Note No notes on file   Allergies Allergies  Allergen Reactions   Meloxicam Hives    Level of Care/Admitting Diagnosis ED Disposition     ED Disposition  Admit   Condition  --   Comment  Hospital Area: [facility] [[id number]]  Level of Care: Progressive [102]  Admit to Progressive based on following criteria: MULTISYSTEM THREATS such as stable sepsis, metabolic/electrolyte imbalance with or without encephalopathy that is responding to early treatment.  May admit patient to [facility] or [facility] if equivalent level of care is available:: No  Covid Evaluation: Asymptomatic - no recent exposure (last 10 days) testing not required  Diagnosis: Cecal diverticulitis [350007]  Admitting Physician: Lorayne Bender [[id number]]  Attending Physician: Acquanetta Belling D [[id number]]  Certification:: I certify this patient will need inpatient services for at least 2 midnights  Expected Medical Readiness: 10/29/2022          B Medical/Surgery History History reviewed. No pertinent past medical history. Past Surgical History:  Procedure Laterality Date   right inguinal hernia repair       A IV Location/Drains/Wounds Patient Lines/Drains/Airways Status     Active Line/Drains/Airways     Name Placement date Placement time Site Days   Peripheral IV 10/26/22 18 G Posterior;Right Forearm 10/26/22  1254  Forearm  1            Intake/Output Last 24 hours  Intake/Output Summary (Last 24 hours) at 10/27/2022 1617 Last data filed at 10/27/2022 6213 Gross per 24 hour  Intake 2951 ml   Output 800 ml  Net 2151 ml    Labs/Imaging Results for orders placed or performed during the hospital encounter of 10/26/22 (from the past 48 hour(s))  Lipase, blood     Status: None   Collection Time: 10/26/22 10:27 AM  Result Value Ref Range   Lipase 23 11 - 51 U/L    Comment: Performed at [facility], [address] [place], [place] [address]  Comprehensive metabolic panel     Status: Abnormal   Collection Time: 10/26/22 10:27 AM  Result Value Ref Range   Sodium 139 135 - 145 mmol/L   Potassium 4.3 3.5 - 5.1 mmol/L   Chloride 102 98 - 111 mmol/L   CO2 20 (L) 22 - 32 mmol/L   Glucose, Bld 157 (H) 70 - 99 mg/dL    Comment: Glucose reference range applies only to samples taken after fasting for at least 8 hours.   BUN 12 6 - 20 mg/dL   Creatinine, Ser 8.46 0.61 - 1.24 mg/dL   Calcium 96.2 8.9 - 95.2 mg/dL   Total Protein 7.9 6.5 - 8.1 g/dL   Albumin 4.8 3.5 - 5.0 g/dL   AST 46 (H) 15 - 41 U/L   ALT 46 (H) 0 - 44 U/L   Alkaline Phosphatase 63 38 - 126 U/L   Total Bilirubin 1.5 (H) 0.3 - 1.2 mg/dL   GFR, Estimated >84 >13 mL/min    Comment: (NOTE) Calculated using the CKD-EPI Creatinine Equation (2021)    Anion gap 17 (H) 5 - 15  Comment: Performed at [facility], [address] [place], [place] [address]  CBC     Status: Abnormal   Collection Time: 10/26/22 10:27 AM  Result Value Ref Range   WBC 18.6 (H) 4.0 - 10.5 K/uL   RBC 4.57 4.22 - 5.81 MIL/uL   Hemoglobin 16.0 13.0 - 17.0 g/dL   HCT 41.3 24.4 - 01.0 %   MCV 98.2 80.0 - 100.0 fL   MCH 35.0 (H) 26.0 - 34.0 pg   MCHC 35.6 30.0 - 36.0 g/dL   RDW 27.2 53.6 - 64.4 %   Platelets 230 150 - 400 K/uL   nRBC 0.0 0.0 - 0.2 %    Comment: Performed at [facility], [address], [place], [place] [address]  Resp panel by RT-PCR (RSV, Flu A&B, Covid) Anterior Nasal Swab     Status: None   Collection Time: 10/26/22  1:25 PM   Specimen: Anterior Nasal Swab  Result Value Ref Range   SARS Coronavirus  2 by RT PCR NEGATIVE NEGATIVE   Influenza A by PCR NEGATIVE NEGATIVE   Influenza B by PCR NEGATIVE NEGATIVE    Comment: (NOTE) The Xpert Xpress SARS-CoV-2/FLU/RSV plus assay is intended as an aid in the diagnosis of influenza from Nasopharyngeal swab specimens and should not be used as a sole basis for treatment. Nasal washings and aspirates are unacceptable for Xpert Xpress SARS-CoV-2/FLU/RSV testing.  Fact Sheet for Patients: BloggerCourse.com  Fact Sheet for Healthcare Providers: SeriousBroker.it  This test is not yet approved or cleared by the [place] FDA and has been authorized for detection and/or diagnosis of SARS-CoV-2 by FDA under an Emergency Use Authorization (EUA). This EUA will remain in effect (meaning this test can be used) for the duration of the COVID-19 declaration under Section 564(b)(1) of the Act, 21 U.S.C. section 360bbb-3(b)(1), unless the authorization is terminated or revoked.     Resp Syncytial Virus by PCR NEGATIVE NEGATIVE    Comment: (NOTE) Fact Sheet for Patients: BloggerCourse.com  Fact Sheet for Healthcare Providers: SeriousBroker.it  This test is not yet approved or cleared by the [place] FDA and has been authorized for detection and/or diagnosis of SARS-CoV-2 by FDA under an Emergency Use Authorization (EUA). This EUA will remain in effect (meaning this test can be used) for the duration of the COVID-19 declaration under Section 564(b)(1) of the Act, 21 U.S.C. section 360bbb-3(b)(1), unless the authorization is terminated or revoked.  Performed at [facility], [address], [place], [place] [address]   I-Stat Lactic Acid, ED     Status: Abnormal   Collection Time: 10/26/22  1:25 PM  Result Value Ref Range   Lactic Acid, Venous 5.2 (HH) 0.5 - 1.9 mmol/L   Comment NOTIFIED PHYSICIAN   Protime-INR     Status: None    Collection Time: 10/26/22  1:25 PM  Result Value Ref Range   Prothrombin Time 12.8 11.4 - 15.2 seconds   INR 0.9 0.8 - 1.2    Comment: (NOTE) INR goal varies based on device and disease states. Performed at [facility], [address] [place], [place] [address]   Blood Culture (routine x 2)     Status: None (Preliminary result)   Collection Time: 10/26/22  1:25 PM   Specimen: BLOOD  Result Value Ref Range   Specimen Description BLOOD SITE NOT SPECIFIED    Special Requests      BOTTLES DRAWN AEROBIC AND ANAEROBIC Blood Culture adequate  volume   Culture      NO GROWTH < 24 HOURS Performed at [facility], [address], [place], [place] [address]    Report Status PENDING   Blood Culture (routine x 2)     Status: None (Preliminary result)   Collection Time: 10/26/22  1:25 PM   Specimen: BLOOD  Result Value Ref Range   Specimen Description BLOOD SITE NOT SPECIFIED    Special Requests      BOTTLES DRAWN AEROBIC AND ANAEROBIC Blood Culture adequate volume   Culture      NO GROWTH < 24 HOURS Performed at [facility], [address] [place], [place] [address]    Report Status PENDING   I-Stat Lactic Acid, ED     Status: None   Collection Time: 10/26/22  1:50 PM  Result Value Ref Range   Lactic Acid, Venous 1.8 0.5 - 1.9 mmol/L  HIV Antibody (routine testing w rflx)     Status: None   Collection Time: 10/26/22  2:32 PM  Result Value Ref Range   HIV Screen 4th Generation wRfx Non Reactive Non Reactive    Comment: Performed at [facility], [address], [place], [place] [address]  Urinalysis, Routine w reflex microscopic -Urine, Clean Catch     Status: Abnormal   Collection Time: 10/26/22  7:45 PM  Result Value Ref Range   Color, Urine YELLOW YELLOW   APPearance CLEAR CLEAR   Specific Gravity, Urine 1.042 (H) 1.005 - 1.030   pH 6.0 5.0 - 8.0   Glucose, UA NEGATIVE NEGATIVE mg/dL   Hgb urine dipstick NEGATIVE NEGATIVE   Bilirubin Urine NEGATIVE  NEGATIVE   Ketones, ur 5 (A) NEGATIVE mg/dL   Protein, ur NEGATIVE NEGATIVE mg/dL   Nitrite NEGATIVE NEGATIVE   Leukocytes,Ua NEGATIVE NEGATIVE    Comment: Performed at [facility], [address] [place], [place] [address]  Comprehensive metabolic panel     Status: Abnormal   Collection Time: 10/27/22  2:42 AM  Result Value Ref Range   Sodium 135 135 - 145 mmol/L   Potassium 3.5 3.5 - 5.1 mmol/L   Chloride 100 98 - 111 mmol/L   CO2 23 22 - 32 mmol/L   Glucose, Bld 109 (H) 70 - 99 mg/dL    Comment: Glucose reference range applies only to samples taken after fasting for at least 8 hours.   BUN 11 6 - 20 mg/dL   Creatinine, Ser 2.44 0.61 - 1.24 mg/dL   Calcium 9.3 8.9 - 01.0 mg/dL   Total Protein 5.9 (L) 6.5 - 8.1 g/dL   Albumin 3.3 (L) 3.5 - 5.0 g/dL   AST 27 15 - 41 U/L   ALT 31 0 - 44 U/L   Alkaline Phosphatase 46 38 - 126 U/L   Total Bilirubin 1.9 (H) 0.3 - 1.2 mg/dL   GFR, Estimated >27 >25 mL/min    Comment: (NOTE) Calculated using the CKD-EPI Creatinine Equation (2021)    Anion gap 12 5 - 15    Comment: Performed at [facility], [address], [place], [place] [address]  CBC     Status: Abnormal   Collection Time: 10/27/22  2:42 AM  Result Value Ref Range   WBC 11.3 (H) 4.0 - 10.5 K/uL   RBC 3.65 (L) 4.22 - 5.81 MIL/uL   Hemoglobin 12.3 (L) 13.0 - 17.0 g/dL   HCT 03.4 (L) 74.2 - 59.5 %   MCV  97.5 80.0 - 100.0 fL   MCH 33.7 26.0 - 34.0 pg   MCHC 34.6 30.0 - 36.0 g/dL   RDW 53.6 64.4 - 03.4 %   Platelets 150 150 - 400 K/uL   nRBC 0.0 0.0 - 0.2 %    Comment: Performed at [facility], [address] [place], [place] [address]   DG Chest Port 1 View  Result Date: 10/26/2022 CLINICAL DATA:  Provided history: Questionable sepsis-evaluate for abnormality. EXAM: PORTABLE CHEST 1 VIEW COMPARISON:  None. FINDINGS: Cardiac monitoring leads overlie the chest. Heart size within normal limits. No appreciable airspace consolidation. No evidence of pleural  effusion or pneumothorax. No acute osseous abnormality identified. Degenerative changes of the spine. IMPRESSION: No evidence of active cardiopulmonary disease. Electronically Signed   By: Jackey Loge D.O.   On: 10/26/2022 14:33   CT ABDOMEN PELVIS W CONTRAST  Result Date: 10/26/2022 CLINICAL DATA:  Abdominal pain. Peritonitis or perforation suspected. EXAM: CT ABDOMEN AND PELVIS WITH CONTRAST TECHNIQUE: Multidetector CT imaging of the abdomen and pelvis was performed using the standard protocol following bolus administration of intravenous contrast. RADIATION DOSE REDUCTION: This exam was performed according to the departmental dose-optimization program which includes automated exposure control, adjustment of the mA and/or kV according to patient size and/or use of iterative reconstruction technique. CONTRAST:  75mL OMNIPAQUE IOHEXOL 350 MG/ML SOLN COMPARISON:  None Available. FINDINGS: Lower chest: Clear lung bases. No significant pleural or pericardial effusion. Hepatobiliary: The liver is normal in density without suspicious focal abnormality. No evidence of gallstones, gallbladder wall thickening or biliary dilatation. Pancreas: Unremarkable. No pancreatic ductal dilatation or surrounding inflammatory changes. Spleen: Normal in size without focal abnormality. Adrenals/Urinary Tract: Both adrenal glands appear normal. No evidence of urinary tract calculus, suspicious renal lesion or hydronephrosis. The bladder appears unremarkable for its degree of distention. Stomach/Bowel: No enteric contrast administered. The stomach appears unremarkable for its degree of distention. No small bowel distension, wall thickening or surrounding inflammation identified. The terminal ileum and appendix appear normal. However, there is wall thickening of the medial cecum with surrounding inflammation and a probable inflamed diverticulum (axial images 48 and 49 of series 3), consistent with diverticulitis. There is a small  amount of fluid in the right pericolic gutter, but no focal extraluminal fluid collection or extraluminal gas. More distally, the colon is decompressed without additional inflammation. Mild sigmoid diverticulosis. Vascular/Lymphatic: There are no enlarged abdominal or pelvic lymph nodes. No acute vascular findings. Mild aortoiliac atherosclerosis. 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 Reproductive: The prostate gland and seminal vesicles appear unremarkable. Other: No evidence of abdominal wall mass or hernia. No ascites or pneumoperitoneum. Musculoskeletal: No acute or significant osseous findings. Unless specific follow-up recommendations are mentioned in the findings or impression sections, no imaging follow-up of any mentioned incidental findings is recommended. IMPRESSION: 1. Findings are most consistent with cecal diverticulitis. There is surrounding inflammation and a small amount of fluid in the right pericolic gutter, but no evidence of bowel perforation or abscess. The appendix and terminal ileum appear normal. 2. No other acute findings. 3.  Aortic Atherosclerosis (ICD10-I70.0). Electronically Signed   By: Carey Bullocks M.D.   On: 10/26/2022 13:47    Pending Labs Unresulted Labs (From admission, onward)     Start     Ordered   10/28/22 0500  CBC  Tomorrow morning,   R        10/27/22 0819   10/28/22 0500  Comprehensive metabolic panel  Tomorrow morning,  R        10/27/22 0819            Vitals/Pain Today's Vitals   10/27/22 1156 10/27/22 1400 10/27/22 1559 10/27/22 1600  BP: (!) 167/100 (!) 199/101 (!) 176/100   Pulse: 76 75 70   Resp: 18 16    Temp: 98.5 F (36.9 C)  98.7 F (37.1 C)   TempSrc: Oral  Oral   SpO2: 99% 97% 98%   Weight:      Height:      PainSc:    5     Isolation Precautions No active isolations  Medications Medications  lactated ringers infusion ( Intravenous Canceled Entry 10/27/22 1603)  enoxaparin (LOVENOX) injection 40 mg (40  mg Subcutaneous Given 10/27/22 1523)  piperacillin-tazobactam (ZOSYN) IVPB 3.375 g (3.375 g Intravenous New Bag/Given 10/27/22 1523)  ondansetron (ZOFRAN-ODT) disintegrating tablet 4 mg (4 mg Oral Given 10/27/22 0826)  ketorolac (TORADOL) 15 MG/ML injection 15 mg (15 mg Intravenous Given 10/27/22 1152)  thiamine (VITAMIN B1) tablet 100 mg (100 mg Oral Given 10/27/22 1015)    Or  thiamine (VITAMIN B1) injection 100 mg ( Intravenous See Alternative 10/27/22 1015)  folic acid (FOLVITE) tablet 1 mg (1 mg Oral Given 10/27/22 1015)  multivitamin with minerals tablet 1 tablet (1 tablet Oral Given 10/27/22 1016)  simethicone (MYLICON) chewable tablet 80 mg (has no administration in time range)  acetaminophen (TYLENOL) tablet 650 mg (650 mg Oral Not Given 10/27/22 1146)  oxyCODONE (Oxy IR/ROXICODONE) immediate release tablet 5 mg (has no administration in time range)  ondansetron (ZOFRAN) injection 4 mg (4 mg Intravenous Given 10/26/22 1029)  ceFEPIme (MAXIPIME) 2 g in sodium chloride 0.9 % 100 mL IVPB (0 g Intravenous Stopped 10/26/22 1457)  metroNIDAZOLE (FLAGYL) IVPB 500 mg (0 mg Intravenous Stopped 10/26/22 1457)  HYDROmorphone (DILAUDID) injection 1 mg (1 mg Intravenous Given 10/26/22 1256)  iohexol (OMNIPAQUE) 350 MG/ML injection 75 mL (75 mLs Intravenous Contrast Given 10/26/22 1201)  ondansetron (ZOFRAN) injection 4 mg (4 mg Intravenous Given 10/26/22 1249)    Mobility walks     Focused Assessments    R Recommendations: See Admitting Provider Note  Report given to:   Additional Notes: Call ot epic message for any additional details  \

## 2022-10-27 NOTE — Assessment & Plan Note (Signed)
Due to diverticulitis as above. Met sepsis criteria on admission with tachycardia, leukocytosis, elevated lactic acid with GI infection source. Some tachycardia overnight but improving. Leukocytosis improved from 18.6 > 11.3. --continue ABX and fluids as above

## 2022-10-27 NOTE — ED Notes (Signed)
CALLED FOR TRANSPORT

## 2022-10-27 NOTE — Assessment & Plan Note (Addendum)
Continues to improve with IV fluids, pain control, and IV zosyn. Surgery consulted and recommended medical treatment. Blood cultures with no growth x 1 day. --continue IV zosyn --continue LR 158mL/hr --continue pain control regimen as below  --PO tylenol 650mg  q6hr scheduled  --toradol 15mg  q6 PRN  --oxy 5mg  q6 PRN for severe pain -- f/u blood cultures -- zofran 4mg  q8 PRN for n/v --AM CBC

## 2022-10-27 NOTE — Progress Notes (Signed)
Progress Note     Subjective: Pt reports overall pain is better than initial presentation but he is experiencing more cramping pain that he thinks is likely gas today. He is passing some flatus. Had small amount of clears yesterday without n/v.   Objective: Vital signs in last 24 hours: Temp:  [97.7 F (36.5 C)-99.1 F (37.3 C)] 98.2 F (36.8 C) (10/19 0516) Pulse Rate:  [65-103] 75 (10/19 0830) Resp:  [14-21] 18 (10/19 0516) BP: (128-168)/(86-110) 168/100 (10/19 0830) SpO2:  [95 %-100 %] 96 % (10/19 0516) Weight:  [68 kg] 68 kg (10/18 1021)    Intake/Output from previous day: 10/18 0701 - 10/19 0700 In: 3051 [I.V.:1000; IV Piggyback:2051] Out: -  Intake/Output this shift: Total I/O In: -  Out: 800 [Urine:800]  PE: General: pleasant, WD, WN male who is laying in bed in NAD Heart: regular, rate, and rhythm.   Lungs: Respiratory effort nonlabored Abd: soft, mild ttp in RLQ without peritonitis, mild distention Psych: A&Ox3 with an appropriate affect.    Lab Results:  Recent Labs    10/26/22 1027 10/27/22 0242  WBC 18.6* 11.3*  HGB 16.0 12.3*  HCT 44.9 35.6*  PLT 230 150   BMET Recent Labs    10/26/22 1027 10/27/22 0242  NA 139 135  K 4.3 3.5  CL 102 100  CO2 20* 23  GLUCOSE 157* 109*  BUN 12 11  CREATININE 1.00 0.82  CALCIUM 10.1 9.3   PT/INR Recent Labs    10/26/22 1325  LABPROT 12.8  INR 0.9   CMP     Component Value Date/Time   NA 135 10/27/2022 0242   K 3.5 10/27/2022 0242   CL 100 10/27/2022 0242   CO2 23 10/27/2022 0242   GLUCOSE 109 (H) 10/27/2022 0242   BUN 11 10/27/2022 0242   CREATININE 0.82 10/27/2022 0242   CALCIUM 9.3 10/27/2022 0242   PROT 5.9 (L) 10/27/2022 0242   ALBUMIN 3.3 (L) 10/27/2022 0242   AST 27 10/27/2022 0242   ALT 31 10/27/2022 0242   ALKPHOS 46 10/27/2022 0242   BILITOT 1.9 (H) 10/27/2022 0242   GFRNONAA >60 10/27/2022 0242   Lipase     Component Value Date/Time   LIPASE 23 10/26/2022 1027        Studies/Results: DG Chest Port 1 View  Result Date: 10/26/2022 CLINICAL DATA:  Provided history: Questionable sepsis-evaluate for abnormality. EXAM: PORTABLE CHEST 1 VIEW COMPARISON:  None. FINDINGS: Cardiac monitoring leads overlie the chest. Heart size within normal limits. No appreciable airspace consolidation. No evidence of pleural effusion or pneumothorax. No acute osseous abnormality identified. Degenerative changes of the spine. IMPRESSION: No evidence of active cardiopulmonary disease. Electronically Signed   By: Jackey Loge D.O.   On: 10/26/2022 14:33   CT ABDOMEN PELVIS W CONTRAST  Result Date: 10/26/2022 CLINICAL DATA:  Abdominal pain. Peritonitis or perforation suspected. EXAM: CT ABDOMEN AND PELVIS WITH CONTRAST TECHNIQUE: Multidetector CT imaging of the abdomen and pelvis was performed using the standard protocol following bolus administration of intravenous contrast. RADIATION DOSE REDUCTION: This exam was performed according to the departmental dose-optimization program which includes automated exposure control, adjustment of the mA and/or kV according to patient size and/or use of iterative reconstruction technique. CONTRAST:  75mL OMNIPAQUE IOHEXOL 350 MG/ML SOLN COMPARISON:  None Available. FINDINGS: Lower chest: Clear lung bases. No significant pleural or pericardial effusion. Hepatobiliary: The liver is normal in density without suspicious focal abnormality. No evidence of gallstones, gallbladder wall thickening or biliary  dilatation. Pancreas: Unremarkable. No pancreatic ductal dilatation or surrounding inflammatory changes. Spleen: Normal in size without focal abnormality. Adrenals/Urinary Tract: Both adrenal glands appear normal. No evidence of urinary tract calculus, suspicious renal lesion or hydronephrosis. The bladder appears unremarkable for its degree of distention. Stomach/Bowel: No enteric contrast administered. The stomach appears unremarkable for its degree of  distention. No small bowel distension, wall thickening or surrounding inflammation identified. The terminal ileum and appendix appear normal. However, there is wall thickening of the medial cecum with surrounding inflammation and a probable inflamed diverticulum (axial images 48 and 49 of series 3), consistent with diverticulitis. There is a small amount of fluid in the right pericolic gutter, but no focal extraluminal fluid collection or extraluminal gas. More distally, the colon is decompressed without additional inflammation. Mild sigmoid diverticulosis. Vascular/Lymphatic: There are no enlarged abdominal or pelvic lymph nodes. No acute vascular findings. Mild aortoiliac atherosclerosis. 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 Reproductive: The prostate gland and seminal vesicles appear unremarkable. Other: No evidence of abdominal wall mass or hernia. No ascites or pneumoperitoneum. Musculoskeletal: No acute or significant osseous findings. Unless specific follow-up recommendations are mentioned in the findings or impression sections, no imaging follow-up of any mentioned incidental findings is recommended. IMPRESSION: 1. Findings are most consistent with cecal diverticulitis. There is surrounding inflammation and a small amount of fluid in the right pericolic gutter, but no evidence of bowel perforation or abscess. The appendix and terminal ileum appear normal. 2. No other acute findings. 3.  Aortic Atherosclerosis (ICD10-I70.0). Electronically Signed   By: Carey Bullocks M.D.   On: 10/26/2022 13:47    Anti-infectives: Anti-infectives (From admission, onward)    Start     Dose/Rate Route Frequency Ordered Stop   10/26/22 2200  piperacillin-tazobactam (ZOSYN) IVPB 3.375 g        3.375 g 12.5 mL/hr over 240 Minutes Intravenous Every 8 hours 10/26/22 1535     10/26/22 1130  ceFEPIme (MAXIPIME) 2 g in sodium chloride 0.9 % 100 mL IVPB        2 g 200 mL/hr over 30 Minutes Intravenous  Once  10/26/22 1129 10/26/22 1457   10/26/22 1130  metroNIDAZOLE (FLAGYL) IVPB 500 mg        500 mg 100 mL/hr over 60 Minutes Intravenous  Once 10/26/22 1129 10/26/22 1457        Assessment/Plan  Cecal diverticulitis - CT 10/18: cecum is diffusely inflamed with evidence of diverticulum, suggesting cecal diverticulitis.  There is a small amount of pericolic gutter fluid/stranding, but no evidence of perforation or abscess.   - lactic acidosis resolved, WBC 11 from 18 - having more cramping pain this AM - stay on CLD today and mobilize as able  - He does not need an emergency operation.  He has essentially uncomplicated diverticulitis.   - continue IV abx therapy and bowel rest.  He will need outpatient GI follow up for a colonoscopy in 6-8 weeks.  We did discuss that if he acutely worsens during his stay that could result in repeat imaging vs even the need for surgery if he were to acutely worsen.       FEN - CLD, SLIV VTE - LMWH ID - IV Zosyn 10/18>>  - per TRH -  Daily marijuana  Daily ETOH - 4 beers/day   LOS: 1 day   I reviewed hospitalist notes, last 24 h vitals and pain scores, last 48 h intake and output, last 24 h labs and trends, and last  24 h imaging results.   Juliet Rude, [facility] 10/27/2022, 8:53 AM Please see Amion for pager number during day hours 7:00am-4:30pm

## 2022-10-27 NOTE — Progress Notes (Signed)
Daily Progress Note Intern Pager: [phone]  Patient name: Patrick Mills Medical record number: [id number] Date of birth: December 29, 1970 Age: 52 y.o. Gender: male  Primary Care Provider: Patient, No Pcp Per Consultants: General surgery Code Status: Full  Pt Overview and Major Events to Date:  10/19: cefepime/flagyl x1 in ED, admitted to FMTS and switched to Zosyn  Assessment and Plan: Patrick Mills is a 52 y.o. male admitted for cecal diverticulitis and sepsis. Managing diverticulitis with IV zosyn and pain control. Has remained afebrile and normo-hypertensive with some ongoing tachycardia and improvement of leukocytosis. Assessment & Plan Cecal diverticulitis Continues to improve with IV fluids, pain control, and IV zosyn. Surgery consulted and recommended medical treatment. Blood cultures with no growth x 1 day. --continue IV zosyn --continue LR 12mL/hr --continue pain control regimen as below  --PO tylenol 650mg  q6hr scheduled  --toradol 15mg  q6 PRN  --oxy 5mg  q6 PRN for severe pain -- f/u blood cultures -- zofran 4mg  q8 PRN for n/v --AM CBC Sepsis without acute organ dysfunction, due to unspecified organism [facility]) Due to diverticulitis as above. Met sepsis criteria on admission with tachycardia, leukocytosis, elevated lactic acid with GI infection source. Some tachycardia overnight but improving. Leukocytosis improved from 18.6 > 11.3. --continue ABX and fluids as above Alcohol use Hx of daily alcohol use for 20-30 years. CIWA score 0 since admission. TOC added substance use resources to AVS. --continue CIWA --thiamine, folate, multivitamin Total bilirubin, elevated Elevated 1.5 > 1.9, likely in the setting of N/V due to diverticulitis -continue to monitor with AM CMP   FEN/GI: clear liquid PPx: lovenox Dispo: pending clinical improvement  Subjective:  Patient states he is having some abdominal pain and nausea/vomiting this morning.  States his pain is nowhere near  what it was when he first came into the hospital but it is bothering him.  Had Tylenol and Toradol a few hours ago, states these have helped somewhat.  Patient has been eating some Jell-O and chicken broth, states this is going okay.  Took a p.o. Zofran recently and was able to keep this down.  Objective: Temp:  [97.7 F (36.5 C)-99.1 F (37.3 C)] 98.2 F (36.8 C) (10/19 0516) Pulse Rate:  [65-103] 65 (10/19 0516) Resp:  [14-21] 18 (10/19 0516) BP: (128-166)/(86-110) 144/94 (10/19 0516) SpO2:  [95 %-100 %] 96 % (10/19 0516) Weight:  [68 kg] 68 kg (10/18 1021) Physical Exam: General: Middle-age man laying in hospital bed, pleasant and conversant somewhat ill-appearing, in no acute distress Cardiovascular: RRR, normal S1/S2, no murmurs, rubs, gallops Respiratory: CTAB, normal WOB on RA, no wheezes, rales, rhonchi Abdomen: Normoactive bowel sounds, somewhat tense with moderate tenderness to palpation of lower abdomen, nondistended Extremities: No edema to BLE  Laboratory: Most recent CBC Lab Results  Component Value Date   WBC 11.3 (H) 10/27/2022   HGB 12.3 (L) 10/27/2022   HCT 35.6 (L) 10/27/2022   MCV 97.5 10/27/2022   PLT 150 10/27/2022   Most recent BMP    Latest Ref Rng & Units 10/27/2022    2:42 AM  BMP  Glucose 70 - 99 mg/dL 301   BUN 6 - 20 mg/dL 11   Creatinine 6.01 - 1.24 mg/dL 0.93   Sodium 235 - 573 mmol/L 135   Potassium 3.5 - 5.1 mmol/L 3.5   Chloride 98 - 111 mmol/L 100   CO2 22 - 32 mmol/L 23   Calcium 8.9 - 10.3 mg/dL 9.3      Imaging/Diagnostic Tests: None  Lorayne Bender, MD 10/27/2022, 7:54 AM  PGY-1, [facility] Family Medicine FPTS Intern pager: [phone], text pages welcome Secure chat group [facility] Teaching Service

## 2022-10-28 DIAGNOSIS — I10 Essential (primary) hypertension: Secondary | ICD-10-CM | POA: Insufficient documentation

## 2022-10-28 HISTORY — DX: Essential (primary) hypertension: I10

## 2022-10-28 LAB — COMPREHENSIVE METABOLIC PANEL
ALT: 32 U/L (ref 0–44)
AST: 32 U/L (ref 15–41)
Albumin: 3.5 g/dL (ref 3.5–5.0)
Alkaline Phosphatase: 46 U/L (ref 38–126)
Anion gap: 11 (ref 5–15)
BUN: 8 mg/dL (ref 6–20)
CO2: 24 mmol/L (ref 22–32)
Calcium: 9.3 mg/dL (ref 8.9–10.3)
Chloride: 102 mmol/L (ref 98–111)
Creatinine, Ser: 0.94 mg/dL (ref 0.61–1.24)
GFR, Estimated: >60 mL/min (ref >60)
Glucose, Bld: 95 mg/dL (ref 70–99)
Potassium: 3.3 mmol/L — ABNORMAL LOW (ref 3.5–5.1)
Sodium: 137 mmol/L (ref 135–145)
Total Bilirubin: 1.3 mg/dL — ABNORMAL HIGH (ref 0.3–1.2)
Total Protein: 6.4 g/dL — ABNORMAL LOW (ref 6.5–8.1)

## 2022-10-28 LAB — CBC
HCT: 36.7 % — ABNORMAL LOW (ref 39.0–52.0)
Hemoglobin: 12.8 g/dL — ABNORMAL LOW (ref 13.0–17.0)
MCH: 33.6 pg (ref 26.0–34.0)
MCHC: 34.9 g/dL (ref 30.0–36.0)
MCV: 96.3 fL (ref 80.0–100.0)
Platelets: 171 10*3/uL (ref 150–400)
RBC: 3.81 MIL/uL — ABNORMAL LOW (ref 4.22–5.81)
RDW: 11.5 % (ref 11.5–15.5)
WBC: 7.7 10*3/uL (ref 4.0–10.5)
nRBC: 0 % (ref 0.0–0.2)

## 2022-10-28 MED ORDER — AMOXICILLIN-POT CLAVULANATE 875-125 MG PO TABS
1.0000 | ORAL_TABLET | Freq: Two times a day (BID) | ORAL | Status: DC
  Administered 2022-10-28: 1 via ORAL
  Filled 2022-10-28: qty 1

## 2022-10-28 MED ORDER — VITAMIN B-1 100 MG PO TABS: 100.0000 mg | ORAL_TABLET | Freq: Every day | ORAL | Status: AC

## 2022-10-28 MED ORDER — OXYCODONE HCL 5 MG PO TABS: 5.0000 mg | ORAL_TABLET | Freq: Four times a day (QID) | ORAL | 0 refills | Status: DC | PRN

## 2022-10-28 MED ORDER — AMOXICILLIN-POT CLAVULANATE 875-125 MG PO TABS: 1.0000 | ORAL_TABLET | Freq: Two times a day (BID) | ORAL | 0 refills | Status: AC

## 2022-10-28 MED ORDER — FOLIC ACID 1 MG PO TABS: 1.0000 mg | ORAL_TABLET | Freq: Every day | ORAL | Status: AC

## 2022-10-28 MED ORDER — ADULT MULTIVITAMIN W/MINERALS CH: 1.0000 | ORAL_TABLET | Freq: Every day | ORAL | Status: AC

## 2022-10-28 MED ORDER — ONDANSETRON 4 MG PO TBDP: 4.0000 mg | ORAL_TABLET | Freq: Three times a day (TID) | ORAL | 0 refills | Status: DC | PRN

## 2022-10-28 MED ORDER — SIMETHICONE 80 MG PO CHEW: 80.0000 mg | CHEWABLE_TABLET | Freq: Four times a day (QID) | ORAL | Status: DC | PRN

## 2022-10-28 MED ORDER — ACETAMINOPHEN 325 MG PO TABS: 650.0000 mg | ORAL_TABLET | Freq: Four times a day (QID) | ORAL | Status: AC

## 2022-10-28 MED ORDER — POTASSIUM CHLORIDE 20 MEQ PO PACK
40.0000 meq | PACK | Freq: Once | ORAL | Status: AC
  Administered 2022-10-28: 40 meq via ORAL
  Filled 2022-10-28: qty 2

## 2022-10-28 NOTE — Progress Notes (Signed)
Paged on-call resident to advise patient with elevated blood pressures and no PRN medications to treat. This RN was advised the family medicine team is aware and have discussed the same earlier with the day team and they are okay with his pressures for now. Will continue to monitor.

## 2022-10-28 NOTE — Discharge Instructions (Addendum)
Dear Patrick Mills,  Thank you for letting us participate in your care. You were hospitalized for abdominal pain and diagnosed with Cecal diverticulitis. You were treated with fluids, antibiotics.   POST-HOSPITAL & CARE INSTRUCTIONS Continue to take all of antibiotics as listed in this packet. Try to have a high-fiber diet to help prevent diverticulitis in the future. You will need to have your blood pressure monitored by your primary care doctor.  You may need medications to help control this. We have provided resources to help stop alcohol use, as this can negatively impact her health. Go to your follow up appointments (listed below)  DOCTOR'S APPOINTMENT   Be sure to follow-up with a primary care doctor soon to make sure that you are doing well.   Take care and be well!  Family Medicine Teaching Service Inpatient Team [place]  [facility]  [address] [place], [place] [address] [phone]

## 2022-10-28 NOTE — Progress Notes (Signed)
     Daily Progress Note Intern Pager: [phone]  Patient name: Patrick Mills Medical record number: [id number] Date of birth: 1970-07-03 Age: 52 y.o. Gender: male  Primary Care Provider: Patient, No Pcp Per Consultants: General surgery Code Status: Full   Pt Overview and Major Events to Date:  10/18: cefepime/flagyl x1 in ED, admitted to FMTS and switched to Zosyn   Assessment and Plan: Patrick Mills is a 52 y.o. male with no known PMH admitted for cecal diverticulitis initially meeting sepsis criteria. Assessment & Plan Cecal diverticulitis Has remained afebrile. Continues to improve with IV fluids, pain control, and IV zosyn. Surgery consulted and recommended medical treatment. Blood cultures with no growth x 2 days. --switch IV zosyn > PO augmentin for remainder of 7 day abx course --continue pain control regimen as below  --PO tylenol 650mg  q6hr scheduled  --transition from toradol 15mg  q6 PRN > ibuprofen 600 mg q6 prn  --oxy 5mg  q6 PRN for severe pain -- f/u blood cultures -- zofran 4mg  q8 PRN for n/v --AM CBC Alcohol use Hx of daily alcohol use for 20-30 years. CIWA score 0 since admission. TOC added substance use resources to AVS. --dc CIWA --thiamine, folate, multivitamin Sepsis without acute organ dysfunction (HCC) Resolved. Due to diverticulitis as above. Met sepsis criteria on admission with tachycardia, leukocytosis, elevated lactic acid with GI infection source. Some tachycardia overnight but improving. Leukocytosis improved from 18.6 > 11.3. --continue ABX and fluids as above Dehydration Likely due to n/v. Improved with fluids and increased PO. Repleting K likely due to same. Total bilirubin improving. --F/u o/p Hypertension Persistently elevated. Now much more comfortable in terms of pain. Needs PCP follow up for BP.  FEN/GI: clear liquid PPx: lovenox Dispo: pending clinical improvement  Subjective:  Doing well this morning.  Ready to go  home.  Objective: Temp:  [98.1 F (36.7 C)-98.9 F (37.2 C)] 98.9 F (37.2 C) (10/19 2020) Pulse Rate:  [67-76] 67 (10/19 2020) Resp:  [11-20] 20 (10/19 2020) BP: (154-199)/(99-109) 161/100 (10/20 0400) SpO2:  [96 %-99 %] 97 % (10/19 2020) Physical Exam: General: Sitting up in bed, no acute distress Cardiovascular: Regular rate and rhythm without murmurs rubs or gallops Respiratory: Clear to auscultation bilateral without wheezes rales or rhonchi Abdomen: Soft, mildly tender diffusely, no distention, hyperactive bowel sounds Extremities: Moves all grossly equally  Laboratory: Most recent CBC Lab Results  Component Value Date   WBC 7.7 10/28/2022   HGB 12.8 (L) 10/28/2022   HCT 36.7 (L) 10/28/2022   MCV 96.3 10/28/2022   PLT 171 10/28/2022   Most recent BMP    Latest Ref Rng & Units 10/28/2022    3:05 AM  BMP  Glucose 70 - 99 mg/dL 95   BUN 6 - 20 mg/dL 8   Creatinine 4.69 - 6.29 mg/dL 5.28   Sodium 413 - 244 mmol/L 137   Potassium 3.5 - 5.1 mmol/L 3.3   Chloride 98 - 111 mmol/L 102   CO2 22 - 32 mmol/L 24   Calcium 8.9 - 10.3 mg/dL 9.3     Evette Georges, MD 10/28/2022, 9:01 AM  PGY-2, [facility] FPTS Intern pager: [phone], text pages welcome Secure chat group [facility] Teaching Service

## 2022-10-28 NOTE — Assessment & Plan Note (Addendum)
Likely due to n/v. Improved with fluids and increased PO. Repleting K likely due to same. Total bilirubin improving. --F/u o/p

## 2022-10-28 NOTE — Assessment & Plan Note (Signed)
Has remained afebrile. Continues to improve with IV fluids, pain control, and IV zosyn. Surgery consulted and recommended medical treatment. Blood cultures with no growth x 2 days. --switch IV zosyn > PO augmentin for remainder of 7 day abx course --continue pain control regimen as below  --PO tylenol 650mg  q6hr scheduled  --transition from toradol 15mg  q6 PRN > ibuprofen 600 mg q6 prn  --oxy 5mg  q6 PRN for severe pain -- f/u blood cultures -- zofran 4mg  q8 PRN for n/v --AM CBC

## 2022-10-28 NOTE — Assessment & Plan Note (Addendum)
Hx of daily alcohol use for 20-30 years. CIWA score 0 since admission. TOC added substance use resources to AVS. --dc CIWA --thiamine, folate, multivitamin

## 2022-10-28 NOTE — Progress Notes (Signed)
Progress Note     Subjective: Pt reports pain much better. Tol CLD and having bowel function.   Objective: Vital signs in last 24 hours: Temp:  [98.5 F (36.9 C)-98.9 F (37.2 C)] 98.9 F (37.2 C) (10/20 0931) Pulse Rate:  [67-76] 71 (10/20 0931) Resp:  [12-20] 12 (10/20 0931) BP: (143-199)/(100-110) 143/110 (10/20 0931) SpO2:  [97 %-99 %] 97 % (10/19 2020) Last BM Date : 10/26/22  Intake/Output from previous day: 10/19 0701 - 10/20 0700 In: -  Out: 800 [Urine:800] Intake/Output this shift: No intake/output data recorded.  PE: General: pleasant, WD, WN male who is laying in bed in NAD Heart: regular, rate, and rhythm.   Lungs: Respiratory effort nonlabored Abd: soft, NT, ND, +BS Psych: A&Ox3 with an appropriate affect.    Lab Results:  Recent Labs    10/27/22 0242 10/28/22 0305  WBC 11.3* 7.7  HGB 12.3* 12.8*  HCT 35.6* 36.7*  PLT 150 171   BMET Recent Labs    10/27/22 0242 10/28/22 0305  NA 135 137  K 3.5 3.3*  CL 100 102  CO2 23 24  GLUCOSE 109* 95  BUN 11 8  CREATININE 0.82 0.94  CALCIUM 9.3 9.3   PT/INR Recent Labs    10/26/22 1325  LABPROT 12.8  INR 0.9   CMP     Component Value Date/Time   NA 137 10/28/2022 0305   K 3.3 (L) 10/28/2022 0305   CL 102 10/28/2022 0305   CO2 24 10/28/2022 0305   GLUCOSE 95 10/28/2022 0305   BUN 8 10/28/2022 0305   CREATININE 0.94 10/28/2022 0305   CALCIUM 9.3 10/28/2022 0305   PROT 6.4 (L) 10/28/2022 0305   ALBUMIN 3.5 10/28/2022 0305   AST 32 10/28/2022 0305   ALT 32 10/28/2022 0305   ALKPHOS 46 10/28/2022 0305   BILITOT 1.3 (H) 10/28/2022 0305   GFRNONAA >60 10/28/2022 0305   Lipase     Component Value Date/Time   LIPASE 23 10/26/2022 1027       Studies/Results: DG Chest Port 1 View  Result Date: 10/26/2022 CLINICAL DATA:  Provided history: Questionable sepsis-evaluate for abnormality. EXAM: PORTABLE CHEST 1 VIEW COMPARISON:  None. FINDINGS: Cardiac monitoring leads overlie the  chest. Heart size within normal limits. No appreciable airspace consolidation. No evidence of pleural effusion or pneumothorax. No acute osseous abnormality identified. Degenerative changes of the spine. IMPRESSION: No evidence of active cardiopulmonary disease. Electronically Signed   By: Jackey Loge D.O.   On: 10/26/2022 14:33   CT ABDOMEN PELVIS W CONTRAST  Result Date: 10/26/2022 CLINICAL DATA:  Abdominal pain. Peritonitis or perforation suspected. EXAM: CT ABDOMEN AND PELVIS WITH CONTRAST TECHNIQUE: Multidetector CT imaging of the abdomen and pelvis was performed using the standard protocol following bolus administration of intravenous contrast. RADIATION DOSE REDUCTION: This exam was performed according to the departmental dose-optimization program which includes automated exposure control, adjustment of the mA and/or kV according to patient size and/or use of iterative reconstruction technique. CONTRAST:  75mL OMNIPAQUE IOHEXOL 350 MG/ML SOLN COMPARISON:  None Available. FINDINGS: Lower chest: Clear lung bases. No significant pleural or pericardial effusion. Hepatobiliary: The liver is normal in density without suspicious focal abnormality. No evidence of gallstones, gallbladder wall thickening or biliary dilatation. Pancreas: Unremarkable. No pancreatic ductal dilatation or surrounding inflammatory changes. Spleen: Normal in size without focal abnormality. Adrenals/Urinary Tract: Both adrenal glands appear normal. No evidence of urinary tract calculus, suspicious renal lesion or hydronephrosis. The bladder appears unremarkable for its  degree of distention. Stomach/Bowel: No enteric contrast administered. The stomach appears unremarkable for its degree of distention. No small bowel distension, wall thickening or surrounding inflammation identified. The terminal ileum and appendix appear normal. However, there is wall thickening of the medial cecum with surrounding inflammation and a probable inflamed  diverticulum (axial images 48 and 49 of series 3), consistent with diverticulitis. There is a small amount of fluid in the right pericolic gutter, but no focal extraluminal fluid collection or extraluminal gas. More distally, the colon is decompressed without additional inflammation. Mild sigmoid diverticulosis. Vascular/Lymphatic: There are no enlarged abdominal or pelvic lymph nodes. No acute vascular findings. Mild aortoiliac atherosclerosis. 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 Reproductive: The prostate gland and seminal vesicles appear unremarkable. Other: No evidence of abdominal wall mass or hernia. No ascites or pneumoperitoneum. Musculoskeletal: No acute or significant osseous findings. Unless specific follow-up recommendations are mentioned in the findings or impression sections, no imaging follow-up of any mentioned incidental findings is recommended. IMPRESSION: 1. Findings are most consistent with cecal diverticulitis. There is surrounding inflammation and a small amount of fluid in the right pericolic gutter, but no evidence of bowel perforation or abscess. The appendix and terminal ileum appear normal. 2. No other acute findings. 3.  Aortic Atherosclerosis (ICD10-I70.0). Electronically Signed   By: Carey Bullocks M.D.   On: 10/26/2022 13:47    Anti-infectives: Anti-infectives (From admission, onward)    Start     Dose/Rate Route Frequency Ordered Stop   10/26/22 2200  piperacillin-tazobactam (ZOSYN) IVPB 3.375 g        3.375 g 12.5 mL/hr over 240 Minutes Intravenous Every 8 hours 10/26/22 1535     10/26/22 1130  ceFEPIme (MAXIPIME) 2 g in sodium chloride 0.9 % 100 mL IVPB        2 g 200 mL/hr over 30 Minutes Intravenous  Once 10/26/22 1129 10/26/22 1457   10/26/22 1130  metroNIDAZOLE (FLAGYL) IVPB 500 mg        500 mg 100 mL/hr over 60 Minutes Intravenous  Once 10/26/22 1129 10/26/22 1457        Assessment/Plan  Cecal diverticulitis - CT 10/18: cecum is  diffusely inflamed with evidence of diverticulum, suggesting cecal diverticulitis.  There is a small amount of pericolic gutter fluid/stranding, but no evidence of perforation or abscess.   - WBC normalized - advance to low fiber diet - stable for DC from surgical standpoint if tol low fiber diet. Recommend establish care with PCP and then GI follow up in 6-8 weeks for colonoscopy. Recommend 10 days total abx, PO augmentin should be fine. Surgery will sign off      FEN - low fiber, SLIV VTE - LMWH ID - IV Zosyn 10/18>>  - per TRH -  Daily marijuana  Daily ETOH - 4 beers/day   LOS: 2 days   I reviewed hospitalist notes, last 24 h vitals and pain scores, last 48 h intake and output, and last 24 h labs and trends.   Juliet Rude, [facility] Surgery 10/28/2022, 10:24 AM Please see Amion for pager number during day hours 7:00am-4:30pm

## 2022-10-28 NOTE — Assessment & Plan Note (Addendum)
Resolved. Due to diverticulitis as above. Met sepsis criteria on admission with tachycardia, leukocytosis, elevated lactic acid with GI infection source. Some tachycardia overnight but improving. Leukocytosis improved from 18.6 > 11.3. --continue ABX and fluids as above

## 2022-10-28 NOTE — TOC Transition Note (Signed)
Transition of Care [facility]) - CM/SW Discharge Note   Patient Details  Name: Patrick Mills MRN: [id number] Date of Birth: 1970-02-16  Transition of Care [facility]) CM/SW Contact:  Ronny Bacon, RN Phone Number: 10/28/2022, 2:04 PM   Clinical Narrative:    Patient is being discharged today. Consult noted for PCP information. Spoke with patient by phone, confirmed address. Patient given information for PCP at [facility], close to patient home. Contact information placed on AVS.   Final next level of care: Home/Self Care Barriers to Discharge: No Barriers Identified   Patient Goals and CMS Choice      Discharge Placement                         Discharge Plan and Services Additional resources added to the After Visit Summary for                                       Social Determinants of Health (SDOH) Interventions SDOH Screenings   Food Insecurity: No Food Insecurity (10/26/2022)  Housing: Low Risk  (10/26/2022)  Transportation Needs: No Transportation Needs (10/26/2022)  Utilities: Not At Risk (10/26/2022)  Tobacco Use: Low Risk  (10/26/2022)     Readmission Risk Interventions     No data to display

## 2022-10-28 NOTE — Assessment & Plan Note (Addendum)
Persistently elevated. Now much more comfortable in terms of pain. Needs PCP follow up for BP.

## 2022-10-28 NOTE — Discharge Summary (Signed)
Family Medicine Teaching [facility] Discharge Summary  Patient name: Patrick Mills Medical record number: [id number] Date of birth: 1970/10/12 Age: 52 y.o. Gender: male Date of Admission: 10/26/2022  Date of Discharge: 10/28/2022 Admitting Physician: Lorayne Bender, MD  Primary Care Provider: Patient, No Pcp Per Consultants: General surgery  Indication for Hospitalization: Uncomplicated diverticulitis requiring IV pain control  Brief Hospital Course:  Patrick Mills is a 52 y.o. male who was admitted to the [facility] Medicine Teaching Service at [facility] for acute cecal diverticulitis. Hospital course is outlined below by problem.   Cecal diverticulitis Presented with severe abdominal pain and peritoneal signs per EDP.  WBC 18.6, AST and ALT 46, AG 17, lipase 23, LA 5.2>1.8.  CT abdomen pelvis with signs of acute cecal diverticulitis without evidence of perforation or abscess.  General surgery was consulted who recommended medical management.  He was admitted for IV cefepime/Flagyl > Zosyn and IV fluids with bowel rest.  He improved quickly and was transitioned to p.o. Augmentin to complete the 7-day course of antibiotics at discharge.  Alcohol use Endorsed 2-4 beers per day for 15-20 years without prior history of withdrawal.  CIWA was ordered which were 0 over admission.  He was given MVI, thiamine, folate as well as substance use resources over admission.  Issues for follow up: Ensure completion of antibiotic course Outpatient GI follow up for a colonoscopy in 6-8 weeks   Monitor blood pressure and consider pharmacologic management if remains elevated    Discharge Diagnoses/Problem List:  Present on Admission:  Cecal diverticulitis Alcohol use  Disposition: Home  Discharge Condition: Stable  Discharge Exam:  General: Sitting up in bed, no acute distress Cardiovascular: Regular rate and rhythm without murmurs rubs or gallops Respiratory: Clear to auscultation bilateral without  wheezes rales or rhonchi Abdomen: Soft, mildly tender diffusely, no distention, hyperactive bowel sounds Extremities: Moves all grossly equally  Significant Labs and Imaging:  Recent Labs  Lab 10/27/22 0242 10/28/22 0305  WBC 11.3* 7.7  HGB 12.3* 12.8*  HCT 35.6* 36.7*  PLT 150 171   Recent Labs  Lab 10/27/22 0242 10/28/22 0305  NA 135 137  K 3.5 3.3*  CL 100 102  CO2 23 24  GLUCOSE 109* 95  BUN 11 8  CREATININE 0.82 0.94  CALCIUM 9.3 9.3  ALKPHOS 46 46  AST 27 32  ALT 31 32  ALBUMIN 3.3* 3.5   DG Chest Port 1 View  Result Date: 10/26/2022 CLINICAL DATA:  Provided history: Questionable sepsis-evaluate for abnormality. EXAM: PORTABLE CHEST 1 VIEW COMPARISON:  None. FINDINGS: Cardiac monitoring leads overlie the chest. Heart size within normal limits. No appreciable airspace consolidation. No evidence of pleural effusion or pneumothorax. No acute osseous abnormality identified. Degenerative changes of the spine. IMPRESSION: No evidence of active cardiopulmonary disease. Electronically Signed   By: Jackey Loge D.O.   On: 10/26/2022 14:33   CT ABDOMEN PELVIS W CONTRAST  Result Date: 10/26/2022 CLINICAL DATA:  Abdominal pain. Peritonitis or perforation suspected. EXAM: CT ABDOMEN AND PELVIS WITH CONTRAST TECHNIQUE: Multidetector CT imaging of the abdomen and pelvis was performed using the standard protocol following bolus administration of intravenous contrast. RADIATION DOSE REDUCTION: This exam was performed according to the departmental dose-optimization program which includes automated exposure control, adjustment of the mA and/or kV according to patient size and/or use of iterative reconstruction technique. CONTRAST:  75mL OMNIPAQUE IOHEXOL 350 MG/ML SOLN COMPARISON:  None Available. FINDINGS: Lower chest: Clear lung bases. No significant pleural or pericardial effusion. Hepatobiliary:  The liver is normal in density without suspicious focal abnormality. No evidence of  gallstones, gallbladder wall thickening or biliary dilatation. Pancreas: Unremarkable. No pancreatic ductal dilatation or surrounding inflammatory changes. Spleen: Normal in size without focal abnormality. Adrenals/Urinary Tract: Both adrenal glands appear normal. No evidence of urinary tract calculus, suspicious renal lesion or hydronephrosis. The bladder appears unremarkable for its degree of distention. Stomach/Bowel: No enteric contrast administered. The stomach appears unremarkable for its degree of distention. No small bowel distension, wall thickening or surrounding inflammation identified. The terminal ileum and appendix appear normal. However, there is wall thickening of the medial cecum with surrounding inflammation and a probable inflamed diverticulum (axial images 48 and 49 of series 3), consistent with diverticulitis. There is a small amount of fluid in the right pericolic gutter, but no focal extraluminal fluid collection or extraluminal gas. More distally, the colon is decompressed without additional inflammation. Mild sigmoid diverticulosis. Vascular/Lymphatic: There are no enlarged abdominal or pelvic lymph nodes. No acute vascular findings. Mild aortoiliac atherosclerosis. 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 Reproductive: The prostate gland and seminal vesicles appear unremarkable. Other: No evidence of abdominal wall mass or hernia. No ascites or pneumoperitoneum. Musculoskeletal: No acute or significant osseous findings. Unless specific follow-up recommendations are mentioned in the findings or impression sections, no imaging follow-up of any mentioned incidental findings is recommended. IMPRESSION: 1. Findings are most consistent with cecal diverticulitis. There is surrounding inflammation and a small amount of fluid in the right pericolic gutter, but no evidence of bowel perforation or abscess. The appendix and terminal ileum appear normal. 2. No other acute findings. 3.   Aortic Atherosclerosis (ICD10-I70.0). Electronically Signed   By: Carey Bullocks M.D.   On: 10/26/2022 13:47    Discharge Medications:  Allergies as of 10/28/2022       Reactions   Meloxicam Hives        Medication List     TAKE these medications    acetaminophen 325 MG tablet Commonly known as: TYLENOL Take 2 tablets (650 mg total) by mouth every 6 (six) hours.   amoxicillin-clavulanate 875-125 MG tablet Commonly known as: AUGMENTIN Take 1 tablet by mouth every 12 (twelve) hours for 9 doses.   folic acid 1 MG tablet Commonly known as: FOLVITE Take 1 tablet (1 mg total) by mouth daily. Start taking on: October 29, 2022   multivitamin with minerals Tabs tablet Take 1 tablet by mouth daily. Start taking on: October 29, 2022   ondansetron 4 MG disintegrating tablet Commonly known as: ZOFRAN-ODT Take 1 tablet (4 mg total) by mouth every 8 (eight) hours as needed for nausea or vomiting.   oxyCODONE 5 MG immediate release tablet Commonly known as: Oxy IR/ROXICODONE Take 1 tablet (5 mg total) by mouth every 6 (six) hours as needed for up to 5 doses for moderate pain (pain score 4-6) or severe pain (pain score 7-10).   simethicone 80 MG chewable tablet Commonly known as: MYLICON Chew 1 tablet (80 mg total) by mouth every 6 (six) hours as needed for flatulence.   thiamine 100 MG tablet Commonly known as: Vitamin B-1 Take 1 tablet (100 mg total) by mouth daily. Start taking on: October 29, 2022        Discharge Instructions: Please refer to Patient Instructions section of EMR for full details.  Patient was counseled important signs and symptoms that should prompt return to medical care, changes in medications, dietary instructions, activity restrictions, and follow up appointments.   Follow-Up Appointments: Advised  to follow-up with a new PCP around the area.  Evette Georges, MD 10/28/2022, 1:33 PM PGY-2, [facility]

## 2022-10-31 ENCOUNTER — Ambulatory Visit (INDEPENDENT_AMBULATORY_CARE_PROVIDER_SITE_OTHER): Payer: Self-pay | Admitting: Nurse Practitioner

## 2022-10-31 ENCOUNTER — Encounter: Payer: Self-pay | Admitting: Nurse Practitioner

## 2022-10-31 VITALS — BP 124/84 | HR 83 | Temp 98.0°F | Ht 66.0 in | Wt 141.8 lb

## 2022-10-31 DIAGNOSIS — F1290 Cannabis use, unspecified, uncomplicated: Secondary | ICD-10-CM | POA: Insufficient documentation

## 2022-10-31 DIAGNOSIS — Z1159 Encounter for screening for other viral diseases: Secondary | ICD-10-CM

## 2022-10-31 DIAGNOSIS — K5732 Diverticulitis of large intestine without perforation or abscess without bleeding: Secondary | ICD-10-CM

## 2022-10-31 DIAGNOSIS — I10 Essential (primary) hypertension: Secondary | ICD-10-CM

## 2022-10-31 DIAGNOSIS — Z09 Encounter for follow-up examination after completed treatment for conditions other than malignant neoplasm: Secondary | ICD-10-CM | POA: Insufficient documentation

## 2022-10-31 DIAGNOSIS — Z13220 Encounter for screening for lipoid disorders: Secondary | ICD-10-CM

## 2022-10-31 DIAGNOSIS — Z789 Other specified health status: Secondary | ICD-10-CM

## 2022-10-31 DIAGNOSIS — Z1211 Encounter for screening for malignant neoplasm of colon: Secondary | ICD-10-CM

## 2022-10-31 DIAGNOSIS — E860 Dehydration: Secondary | ICD-10-CM

## 2022-10-31 LAB — CULTURE, BLOOD (ROUTINE X 2)
Culture: NO GROWTH
Culture: NO GROWTH
Special Requests: ADEQUATE
Special Requests: ADEQUATE

## 2022-10-31 NOTE — Assessment & Plan Note (Signed)
Drinks 2-4 beer on some day  Has not had a drink of alcohol since his recent hospitalization Patient encouraged to avoid more than 2 alcoholic drink daily and if possible abstain from drinking alcohol.  He verbalized understanding Continue folic acid 1 mg daily, thiamine 100 mg daily

## 2022-10-31 NOTE — Progress Notes (Signed)
New Patient Office Visit  Subjective:  Patient ID: Patrick Mills, male    DOB: 1970-04-08  Age: 52 y.o. MRN: [id number]  CC:  Chief Complaint  Patient presents with   Hospitalization Follow-up   Establish Care    HPI Patrick Mills is a 52 y.o. male  has a past medical history of Alcohol use (10/26/2022), Cecal diverticulitis (10/26/2022), and Hypertension (10/28/2022).  Patient presents for hospital follow-up and to establish care for his chronic medical conditions Has no previous PCP.  Patient was on admission at the hospital from 10/26/2022 to 10/28/2022 for cecal diverticulitis patient was treated with IV antibiotics and discharged home on Augmentin.  Patient stated that he has been taking Augmentin as ordered.  Stated that ''I feel a lot better'' he denies fever, malaise, chest pain, shortness of breath, abdominal pain, nausea, vomiting, diarrhea.  He needs a colonoscopy in 6 to 8 weeks.  Patient referred to GI today.     Past Medical History:  Diagnosis Date   Alcohol use 10/26/2022   Cecal diverticulitis 10/26/2022   Hypertension 10/28/2022    Past Surgical History:  Procedure Laterality Date   right inguinal hernia repair      Family History  Problem Relation Age of Onset   Arthritis Mother    Arthritis Father    Colon cancer Paternal Grandfather    Prostate cancer Neg Hx    Lung cancer Neg Hx     Social History   Socioeconomic History   Marital status: Married    Spouse name: Not on file   Number of children: Not on file   Years of education: Not on file   Highest education level: Not on file  Occupational History   Not on file  Tobacco Use   Smoking status: Never   Smokeless tobacco: Never  Substance and Sexual Activity   Alcohol use: Yes    Alcohol/week: 28.0 standard drinks of alcohol    Types: 28 Cans of beer per week   Drug use: Yes    Types: Marijuana    Comment: sometimes   Sexual activity: Yes  Other Topics Concern   Not  on file  Social History Narrative   Works in [employer]. Lives with his wife    Social Determinants of Health   Financial Resource Strain: Not on file  Food Insecurity: No Food Insecurity (10/26/2022)   Hunger Vital Sign    Worried About Running Out of Food in the Last Year: Never true    Ran Out of Food in the Last Year: Never true  Transportation Needs: No Transportation Needs (10/26/2022)   PRAPARE - Administrator, Civil Service (Medical): No    Lack of Transportation (Non-Medical): No  Physical Activity: Not on file  Stress: Not on file  Social Connections: Not on file  Intimate Partner Violence: Not At Risk (10/26/2022)   Humiliation, Afraid, Rape, and Kick questionnaire    Fear of Current or Ex-Partner: No    Emotionally Abused: No    Physically Abused: No    Sexually Abused: No    ROS Review of Systems  Constitutional:  Negative for appetite change, chills, fatigue and fever.  HENT:  Negative for congestion, postnasal drip, rhinorrhea and sneezing.   Respiratory:  Negative for cough, shortness of breath and wheezing.   Cardiovascular:  Negative for chest pain, palpitations and leg swelling.  Gastrointestinal:  Negative for abdominal pain, constipation, nausea and vomiting.  Genitourinary:  Negative for difficulty  urinating, dysuria, flank pain and frequency.  Musculoskeletal:  Negative for arthralgias, back pain, joint swelling and myalgias.  Skin:  Negative for color change, pallor, rash and wound.  Neurological:  Negative for dizziness, facial asymmetry, weakness, numbness and headaches.  Psychiatric/Behavioral:  Negative for behavioral problems, confusion, self-injury and suicidal ideas.     Objective:   Today's Vitals: BP 124/84   Pulse 83   Temp 98 F (36.7 C)   Ht 5\' 6"  (1.676 m)   Wt 141 lb 12.8 oz (64.3 kg)   SpO2 100%   BMI 22.89 kg/m   Physical Exam Vitals and nursing note reviewed.  Constitutional:      General: He is not in acute  distress.    Appearance: Normal appearance. He is not ill-appearing, toxic-appearing or diaphoretic.  HENT:     Mouth/Throat:     Mouth: Mucous membranes are moist.     Pharynx: Oropharynx is clear. No oropharyngeal exudate or posterior oropharyngeal erythema.  Eyes:     General: No scleral icterus.       Right eye: No discharge.        Left eye: No discharge.     Extraocular Movements: Extraocular movements intact.     Conjunctiva/sclera: Conjunctivae normal.  Cardiovascular:     Rate and Rhythm: Normal rate and regular rhythm.     Pulses: Normal pulses.     Heart sounds: Normal heart sounds. No murmur heard.    No friction rub. No gallop.  Pulmonary:     Effort: Pulmonary effort is normal. No respiratory distress.     Breath sounds: Normal breath sounds. No stridor. No wheezing, rhonchi or rales.  Chest:     Chest wall: No tenderness.  Abdominal:     General: There is no distension.     Palpations: Abdomen is soft.     Tenderness: There is no abdominal tenderness. There is no right CVA tenderness, left CVA tenderness or guarding.  Musculoskeletal:        General: No swelling, tenderness, deformity or signs of injury.     Right lower leg: No edema.     Left lower leg: No edema.  Skin:    General: Skin is warm and dry.     Capillary Refill: Capillary refill takes less than 2 seconds.     Coloration: Skin is not jaundiced or pale.     Findings: No bruising, erythema or lesion.  Neurological:     Mental Status: He is alert and oriented to person, place, and time.     Motor: No weakness.     Coordination: Coordination normal.     Gait: Gait normal.  Psychiatric:        Mood and Affect: Mood normal.        Behavior: Behavior normal.        Thought Content: Thought content normal.        Judgment: Judgment normal.     Assessment & Plan:   Problem List Items Addressed This Visit       Cardiovascular and Mediastinum   Hypertension    BP Readings from Last 3 Encounters:   10/31/22 124/84  10/28/22 (!) 143/110  08/22/13 (!) 178/105  Blood pressure is normal in the office today Blood pressure goal is less than 140/90 Patient encouraged to monitor blood pressure at home and report blood pressure readings consistently greater than 140/90        Digestive   Cecal diverticulitis - Primary  States that he feels much better Currently denies abdominal pain nausea vomiting diarrhea Encouraged to continue full course of Augmentin ordered Patient referred to GI for colonoscopy      Relevant Orders   CBC   CMP14+EGFR   Ambulatory referral to Gastroenterology     Other   Alcohol use    Drinks 2-4 beer on some day  Has not had a drink of alcohol since his recent hospitalization Patient encouraged to avoid more than 2 alcoholic drink daily and if possible abstain from drinking alcohol.  He verbalized understanding Continue folic acid 1 mg daily, thiamine 100 mg daily      Dehydration    Will check CMP Lab Results  Component Value Date   NA 137 10/28/2022   K 3.3 (L) 10/28/2022   CO2 24 10/28/2022   GLUCOSE 95 10/28/2022   BUN 8 10/28/2022   CREATININE 0.94 10/28/2022   CALCIUM 9.3 10/28/2022   GFRNONAA >60 10/28/2022         Marijuana user    Cessation encouraged      Hospital discharge follow-up    Hospital chart reviewed, including discharge summary Medications reconciled and reviewed with the patient in detail       Other Visit Diagnoses     Screening for lipid disorders       Relevant Orders   Lipid panel   Screening for colon cancer       Relevant Orders   Ambulatory referral to Gastroenterology   Need for hepatitis C screening test       Relevant Orders   Hepatitis C antibody       Outpatient Encounter Medications as of 10/31/2022  Medication Sig   acetaminophen (TYLENOL) 325 MG tablet Take 2 tablets (650 mg total) by mouth every 6 (six) hours.   amoxicillin-clavulanate (AUGMENTIN) 875-125 MG tablet Take 1 tablet by  mouth every 12 (twelve) hours for 9 doses.   folic acid (FOLVITE) 1 MG tablet Take 1 tablet (1 mg total) by mouth daily.   Multiple Vitamin (MULTIVITAMIN WITH MINERALS) TABS tablet Take 1 tablet by mouth daily.   simethicone (MYLICON) 80 MG chewable tablet Chew 1 tablet (80 mg total) by mouth every 6 (six) hours as needed for flatulence.   thiamine (VITAMIN B-1) 100 MG tablet Take 1 tablet (100 mg total) by mouth daily.   ondansetron (ZOFRAN-ODT) 4 MG disintegrating tablet Take 1 tablet (4 mg total) by mouth every 8 (eight) hours as needed for nausea or vomiting. (Patient not taking: Reported on 10/31/2022)   oxyCODONE (OXY IR/ROXICODONE) 5 MG immediate release tablet Take 1 tablet (5 mg total) by mouth every 6 (six) hours as needed for up to 5 doses for moderate pain (pain score 4-6) or severe pain (pain score 7-10). (Patient not taking: Reported on 10/31/2022)   No facility-administered encounter medications on file as of 10/31/2022.    Follow-up: Return in about 1 year (around 10/31/2023) for FASTING LABS next  WEEK Monday.   Donell Beers, FNP

## 2022-10-31 NOTE — Patient Instructions (Signed)
Please consider getting Influenza Shingrix and Tdap vaccine at local pharmacy.    It is important that you exercise regularly at least 30 minutes 5 times a week as tolerated  Think about what you will eat, plan ahead. Choose " clean, green, fresh or frozen" over canned, processed or packaged foods which are more sugary, salty and fatty. 70 to 75% of food eaten should be vegetables and fruit. Three meals at set times with snacks allowed between meals, but they must be fruit or vegetables. Aim to eat over a 12 hour period , example 7 am to 7 pm, and STOP after  your last meal of the day. Drink water,generally about 64 ounces per day, no other drink is as healthy. Fruit juice is best enjoyed in a healthy way, by EATING the fruit.  Thanks for choosing [facility] we consider it a privelige to serve you.

## 2022-10-31 NOTE — Assessment & Plan Note (Signed)
Will check CMP Lab Results  Component Value Date   NA 137 10/28/2022   K 3.3 (L) 10/28/2022   CO2 24 10/28/2022   GLUCOSE 95 10/28/2022   BUN 8 10/28/2022   CREATININE 0.94 10/28/2022   CALCIUM 9.3 10/28/2022   GFRNONAA >60 10/28/2022

## 2022-10-31 NOTE — Assessment & Plan Note (Signed)
Hospital chart reviewed, including discharge summary Medications reconciled and reviewed with the patient in detail 

## 2022-10-31 NOTE — Assessment & Plan Note (Signed)
BP Readings from Last 3 Encounters:  10/31/22 124/84  10/28/22 (!) 143/110  08/22/13 (!) 178/105  Blood pressure is normal in the office today Blood pressure goal is less than 140/90 Patient encouraged to monitor blood pressure at home and report blood pressure readings consistently greater than 140/90

## 2022-10-31 NOTE — Assessment & Plan Note (Signed)
States that he feels much better Currently denies abdominal pain nausea vomiting diarrhea Encouraged to continue full course of Augmentin ordered Patient referred to GI for colonoscopy

## 2022-10-31 NOTE — Assessment & Plan Note (Signed)
Cessation encouraged 

## 2022-11-05 ENCOUNTER — Other Ambulatory Visit (INDEPENDENT_AMBULATORY_CARE_PROVIDER_SITE_OTHER): Payer: Self-pay

## 2022-11-05 DIAGNOSIS — K5732 Diverticulitis of large intestine without perforation or abscess without bleeding: Secondary | ICD-10-CM

## 2022-11-05 DIAGNOSIS — Z13220 Encounter for screening for lipoid disorders: Secondary | ICD-10-CM

## 2022-11-05 DIAGNOSIS — Z1159 Encounter for screening for other viral diseases: Secondary | ICD-10-CM

## 2022-11-06 ENCOUNTER — Other Ambulatory Visit: Payer: Self-pay | Admitting: Nurse Practitioner

## 2022-11-06 DIAGNOSIS — D7589 Other specified diseases of blood and blood-forming organs: Secondary | ICD-10-CM

## 2022-11-06 LAB — CMP14+EGFR
ALT: 42 [IU]/L (ref 0–44)
AST: 39 [IU]/L (ref 0–40)
Albumin: 4.7 g/dL (ref 3.8–4.9)
Alkaline Phosphatase: 70 [IU]/L (ref 44–121)
BUN/Creatinine Ratio: 10 (ref 9–20)
BUN: 11 mg/dL (ref 6–24)
Bilirubin Total: 0.5 mg/dL (ref 0.0–1.2)
CO2: 21 mmol/L (ref 20–29)
Calcium: 9.7 mg/dL (ref 8.7–10.2)
Chloride: 101 mmol/L (ref 96–106)
Creatinine, Ser: 1.15 mg/dL (ref 0.76–1.27)
Globulin, Total: 2.6 g/dL (ref 1.5–4.5)
Glucose: 92 mg/dL (ref 70–99)
Potassium: 4.6 mmol/L (ref 3.5–5.2)
Sodium: 141 mmol/L (ref 134–144)
Total Protein: 7.3 g/dL (ref 6.0–8.5)
eGFR: 76 mL/min/{1.73_m2} (ref >59)

## 2022-11-06 LAB — CBC
Hematocrit: 44.7 % (ref 37.5–51.0)
Hemoglobin: 14.7 g/dL (ref 13.0–17.7)
MCH: 33.4 pg — ABNORMAL HIGH (ref 26.6–33.0)
MCHC: 32.9 g/dL (ref 31.5–35.7)
MCV: 102 fL — ABNORMAL HIGH (ref 79–97)
Platelets: 323 10*3/uL (ref 150–450)
RBC: 4.4 x10E6/uL (ref 4.14–5.80)
RDW: 11.3 % — ABNORMAL LOW (ref 11.6–15.4)
WBC: 6.1 10*3/uL (ref 3.4–10.8)

## 2022-11-06 LAB — LIPID PANEL
Chol/HDL Ratio: 3.3 ratio (ref 0.0–5.0)
Cholesterol, Total: 182 mg/dL (ref 100–199)
HDL: 55 mg/dL (ref >39)
LDL Chol Calc (NIH): 109 mg/dL — ABNORMAL HIGH (ref 0–99)
Triglycerides: 98 mg/dL (ref 0–149)
VLDL Cholesterol Cal: 18 mg/dL (ref 5–40)

## 2022-11-06 LAB — HEPATITIS C ANTIBODY: Hep C Virus Ab: NONREACTIVE

## 2022-11-10 LAB — SPECIMEN STATUS REPORT

## 2022-11-10 LAB — B12 AND FOLATE PANEL
Folate: >20 ng/mL
Vitamin B-12: 806 pg/mL (ref 232–1245)

## 2022-11-14 ENCOUNTER — Encounter: Payer: Self-pay | Admitting: Nurse Practitioner

## 2023-02-11 ENCOUNTER — Encounter: Payer: Self-pay | Admitting: Nurse Practitioner

## 2023-02-11 ENCOUNTER — Ambulatory Visit (INDEPENDENT_AMBULATORY_CARE_PROVIDER_SITE_OTHER): Payer: Self-pay | Admitting: Nurse Practitioner

## 2023-02-11 VITALS — BP 126/84 | HR 75 | Ht 66.0 in | Wt 143.5 lb

## 2023-02-11 DIAGNOSIS — K602 Anal fissure, unspecified: Secondary | ICD-10-CM

## 2023-02-11 DIAGNOSIS — K600 Acute anal fissure: Secondary | ICD-10-CM

## 2023-02-11 DIAGNOSIS — K5732 Diverticulitis of large intestine without perforation or abscess without bleeding: Secondary | ICD-10-CM

## 2023-02-11 DIAGNOSIS — Z800 Family history of malignant neoplasm of digestive organs: Secondary | ICD-10-CM

## 2023-02-11 DIAGNOSIS — K625 Hemorrhage of anus and rectum: Secondary | ICD-10-CM

## 2023-02-11 MED ORDER — SUFLAVE 178.7 G PO SOLR: 1.0000 | Freq: Once | ORAL | 0 refills | Status: AC

## 2023-02-11 MED ORDER — AMBULATORY NON FORMULARY MEDICATION: 1 refills | Status: DC

## 2023-02-11 NOTE — Progress Notes (Signed)
02/11/2023 Norm Parcel [id number] [birth year]   CHIEF COMPLAINT: Schedule colonoscopy, rectal pain and bleeding  HISTORY OF PRESENT ILLNESS: Patrick Mills is a 53 year old male with a past medical history of hypertension, remote GERD and cecal diverticulitis. Past inguinal hernia repair 1984, 1989 and past wisdom teeth extraction.  He presents to our office today as referred by Edwin Dada NP to schedule a colonoscopy. He was admitted to the hospital 10/26/2022 secondary to acute abdominal pain x 1 day.  He was diagnosed with cecal diverticulitis. Labs in the ED showed a WBC 18.6. CTAP identified acute cecal diverticulitis without evidence of perforation or abscess. General surgery was consulted who recommended medical management. He was treated with IV Cefepime, Flagyl and Zosyn and his abdominal pain significantly improved and he was discharged home 10/20 on Augmentin p.o. x 7 days.  He had residual lower abdominal tenderness for few days postdischarge which completely abated without recurrence.  No prior history of diverticulitis.  Never had a screening colonoscopy.  Paternal grandfather was diagnosed with colon cancer in his 35s or around the age of 45.  He typically passes a normal brown formed stool most days.  About 1 and half weeks ago, he developed rectal pain and passed a small to moderate amount of bright red blood on the toilet tissue and in the toilet water for 3 consecutive days.  His rectal pain was significant, could not sit or stand.  Since then, his rectal pain has significantly improved and no further rectal bleeding.  He eats a high-fiber diet.  He takes a few fiber Gummies daily.  He drinks 1 L of water daily.  No NSAID use.  Alcohol intake decreased to 2 beers daily.      Latest Ref Rng & Units 11/05/2022    8:22 AM 10/28/2022    3:05 AM 10/27/2022    2:42 AM  CBC  WBC 3.4 - 10.8 x10E3/uL 6.1  7.7  11.3   Hemoglobin 13.0 - 17.7 g/dL 82.9  56.2  13.0    Hematocrit 37.5 - 51.0 % 44.7  36.7  35.6   Platelets 150 - 450 x10E3/uL 323  171  150         Latest Ref Rng & Units 11/05/2022    8:22 AM 10/28/2022    3:05 AM 10/27/2022    2:42 AM  CMP  Glucose 70 - 99 mg/dL 92  95  865   BUN 6 - 24 mg/dL 11  8  11    Creatinine 0.76 - 1.27 mg/dL 7.84  6.96  2.95   Sodium 134 - 144 mmol/L 141  137  135   Potassium 3.5 - 5.2 mmol/L 4.6  3.3  3.5   Chloride 96 - 106 mmol/L 101  102  100   CO2 20 - 29 mmol/L 21  24  23    Calcium 8.7 - 10.2 mg/dL 9.7  9.3  9.3   Total Protein 6.0 - 8.5 g/dL 7.3  6.4  5.9   Total Bilirubin 0.0 - 1.2 mg/dL 0.5  1.3  1.9   Alkaline Phos 44 - 121 IU/L 70  46  46   AST 0 - 40 IU/L 39  32  27   ALT 0 - 44 IU/L 42  32  31      CTAP with contrast 10/26/2022: FINDINGS: Lower chest: Clear lung bases. No significant pleural or pericardial effusion.   Hepatobiliary: The liver is normal in density without suspicious  focal abnormality. No evidence of gallstones, gallbladder wall thickening or biliary dilatation.   Pancreas: Unremarkable. No pancreatic ductal dilatation or surrounding inflammatory changes.   Spleen: Normal in size without focal abnormality.   Adrenals/Urinary Tract: Both adrenal glands appear normal. No evidence of urinary tract calculus, suspicious renal lesion or hydronephrosis. The bladder appears unremarkable for its degree of distention.   Stomach/Bowel: No enteric contrast administered. The stomach appears unremarkable for its degree of distention. No small bowel distension, wall thickening or surrounding inflammation identified. The terminal ileum and appendix appear normal. However, there is wall thickening of the medial cecum with surrounding inflammation and a probable inflamed diverticulum (axial images 48 and 49 of series 3), consistent with diverticulitis. There is a small amount of fluid in the right pericolic gutter, but no focal extraluminal fluid collection or extraluminal gas.  More distally, the colon is decompressed without additional inflammation. Mild sigmoid diverticulosis.   Vascular/Lymphatic: There are no enlarged abdominal or pelvic lymph nodes. No acute vascular findings. Mild aortoiliac atherosclerosis. 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   Reproductive: The prostate gland and seminal vesicles appear unremarkable.   Other: No evidence of abdominal wall mass or hernia. No ascites or pneumoperitoneum.   Musculoskeletal: No acute or significant osseous findings.   Unless specific follow-up recommendations are mentioned in the findings or impression sections, no imaging follow-up of any mentioned incidental findings is recommended.   IMPRESSION: 1. Findings are most consistent with cecal diverticulitis. There is surrounding inflammation and a small amount of fluid in the right pericolic gutter, but no evidence of bowel perforation or abscess. The appendix and terminal ileum appear normal. 2. No other acute findings. 3.  Aortic Atherosclerosis    Social History: He is married.  He is a Field seismologist.  He smoked cigarettes 1/2 ppd x 15 years, quit 15 years ago. He drinks 2 beers daily. He smokes marijuana almost every day.   Family History: Maternal grandfather with history of colon cancer.  Mother and father with history of arthritis.   Allergies  Allergen Reactions   Meloxicam Hives      Outpatient Encounter Medications as of 02/11/2023  Medication Sig   folic acid (FOLVITE) 1 MG tablet Take 1 tablet (1 mg total) by mouth daily.   Multiple Vitamin (MULTIVITAMIN WITH MINERALS) TABS tablet Take 1 tablet by mouth daily.   thiamine (VITAMIN B-1) 100 MG tablet Take 1 tablet (100 mg total) by mouth daily.   acetaminophen (TYLENOL) 325 MG tablet Take 2 tablets (650 mg total) by mouth every 6 (six) hours. (Patient not taking: Reported on 02/11/2023)   ondansetron (ZOFRAN-ODT) 4 MG disintegrating tablet Take 1 tablet (4 mg  total) by mouth every 8 (eight) hours as needed for nausea or vomiting. (Patient not taking: Reported on 10/31/2022)   oxyCODONE (OXY IR/ROXICODONE) 5 MG immediate release tablet Take 1 tablet (5 mg total) by mouth every 6 (six) hours as needed for up to 5 doses for moderate pain (pain score 4-6) or severe pain (pain score 7-10). (Patient not taking: Reported on 10/31/2022)   simethicone (MYLICON) 80 MG chewable tablet Chew 1 tablet (80 mg total) by mouth every 6 (six) hours as needed for flatulence.   No facility-administered encounter medications on file as of 02/11/2023.    REVIEW OF SYSTEMS:  Gen: Denies fever, sweats or chills. No weight loss.  CV: Denies chest pain, palpitations or edema. Resp: Denies cough, shortness of breath of hemoptysis.  GI: See HPI. Remote history  of GERD, no recent symptoms.   GU: Denies urinary burning, blood in urine, increased urinary frequency or incontinence. MS: Denies joint pain, muscles aches or weakness. Derm: Denies rash, itchiness, skin lesions or unhealing ulcers. Psych: Denies depression, anxiety, memory loss or confusion. Heme: Denies bruising, easy bleeding. Neuro:  Denies headaches, dizziness or paresthesias. Endo:  Denies any problems with DM, thyroid or adrenal function.  PHYSICAL EXAM: BP 126/84   Pulse 75   Ht 5\' 6"  (1.676 m)   Wt 143 lb 8 oz (65.1 kg)   BMI 23.16 kg/m   General: 53 year old male in no acute distress. Head: Normocephalic and atraumatic. Eyes:  Sclerae non-icteric, conjunctive pink. Ears: Normal auditory acuity. Mouth: Dentition intact. No ulcers or lesions.  Neck: Supple, no lymphadenopathy or thyromegaly.  Lungs: Clear bilaterally to auscultation without wheezes, crackles or rhonchi. Heart: Regular rate and rhythm. No murmur, rub or gallop appreciated.  Abdomen: Soft, nontender, nondistended. No masses. No hepatosplenomegaly. Normoactive bowel sounds x 4 quadrants.  Rectal: Posterior external hemorrhoid mildly  inflamed without thrombosis, posterior anal fissure with associated sentinel tag,  anal fissure oozed a small amount of bright red blood on exam and on internal exam anal derm felt irregular, sandpaper texture.  Limited rectal exam secondary to pain.  Dottie RN present during exam. Musculoskeletal: Symmetrical with no gross deformities. Skin: Warm and dry. No rash or lesions on visible extremities. Extremities: No edema. Neurological: Alert oriented x 4, no focal deficits.  Psychological:  Alert and cooperative. Normal mood and affect.  ASSESSMENT AND PLAN:  53 year old male admitted to the hospital 10/18 - 10/28/2022 with acute cecal diverticulitis confirmed per CT which resolved after receiving  IV antibiotics then transitioned to oral antibiotics without recurrence.  Paternal grandfather with history of colon cancer. -Colonoscopy benefits and risks discussed including risk with sedation, risk of bleeding, perforation and infection  -Patient to contact office if abdominal pain recurs  Posterior anal fissure -Diltiazem 2%/Lidocaine 5% apply a small amount inside the anal opening into the external anal area 3 times daily x 6 weeks -Benefiber 1 tablespoon daily -MiraLAX nightly -Drink 64 ounces of water daily -Colonoscopy as ordered above -Patient to contact office if rectal pain/bleeding persists or worsens prior to colonoscopy date  Further recommendations to be determined after colonoscopy completed         CC:  Paseda, Baird Kay, FNP

## 2023-02-11 NOTE — Patient Instructions (Addendum)
You have been scheduled for a colonoscopy. Please follow written instructions given to you at your visit today.   Please pick up your prep supplies at the pharmacy within the next 1-3 days.  If you use inhalers (even only as needed), please bring them with you on the day of your procedure. ____________________________________________ Your provider has prescribed Diltiazem gel for you. Please follow the directions written on your prescription bottle or given to you specifically by your provider. Since this is a specialty medication and is not readily available at most local pharmacies, we have sent your prescription to:  [facility] information [facility]: Address: [address] [place], [place] [address]  [phone]  *Please DO NOT go directly from our office to pick up this medication! Give the pharmacy 1 day to process the prescription as this is compounded and takes time to make.  Miralax- take every night as tolerated to avoid straining  Benefiber- take 1 tablespoon daily to regulate bowel movements  Due to recent changes in healthcare laws, you may see the results of your imaging and laboratory studies on MyChart before your provider has had a chance to review them.  We understand that in some cases there may be results that are confusing or concerning to you. Not all laboratory results come back in the same time frame and the provider may be waiting for multiple results in order to interpret others.  Please give [place] 48 hours in order for your provider to thoroughly review all the results before contacting the office for clarification of your results.   Thank you for trusting me with your gastrointestinal care!   Alcide Evener, CRNP

## 2023-02-12 ENCOUNTER — Telehealth: Payer: Self-pay | Admitting: Nurse Practitioner

## 2023-02-12 NOTE — Telephone Encounter (Signed)
Patient called requesting to speak with a nurse for an update on Ditialzem\ Lidocaine medication to be sent to the [facility]

## 2023-02-13 MED ORDER — AMBULATORY NON FORMULARY MEDICATION: 1 refills | Status: AC

## 2023-02-13 MED ORDER — AMBULATORY NON FORMULARY MEDICATION: 1 refills | Status: DC

## 2023-02-13 NOTE — Telephone Encounter (Signed)
 Pt stated that he recently had an office visit and was notified that the medication would be called into [facility]. Pt stated that [facility] informed him that they have no prescription. Please review

## 2023-02-13 NOTE — Telephone Encounter (Signed)
Contacted [employer] & spoke with [place]. [place] stated that they did not receive the prescription and took a verbal order over the phone.

## 2023-03-29 ENCOUNTER — Encounter: Payer: Self-pay | Admitting: Gastroenterology

## 2023-03-29 ENCOUNTER — Ambulatory Visit: Payer: Self-pay | Admitting: Gastroenterology

## 2023-03-29 VITALS — BP 133/96 | HR 60 | Temp 97.9°F | Resp 16 | Ht 66.0 in | Wt 143.0 lb

## 2023-03-29 DIAGNOSIS — K640 First degree hemorrhoids: Secondary | ICD-10-CM

## 2023-03-29 DIAGNOSIS — K5730 Diverticulosis of large intestine without perforation or abscess without bleeding: Secondary | ICD-10-CM

## 2023-03-29 DIAGNOSIS — K625 Hemorrhage of anus and rectum: Secondary | ICD-10-CM

## 2023-03-29 DIAGNOSIS — K644 Residual hemorrhoidal skin tags: Secondary | ICD-10-CM

## 2023-03-29 DIAGNOSIS — D123 Benign neoplasm of transverse colon: Secondary | ICD-10-CM

## 2023-03-29 DIAGNOSIS — K5732 Diverticulitis of large intestine without perforation or abscess without bleeding: Secondary | ICD-10-CM

## 2023-03-29 DIAGNOSIS — D124 Benign neoplasm of descending colon: Secondary | ICD-10-CM

## 2023-03-29 DIAGNOSIS — K635 Polyp of colon: Secondary | ICD-10-CM

## 2023-03-29 DIAGNOSIS — D125 Benign neoplasm of sigmoid colon: Secondary | ICD-10-CM

## 2023-03-29 DIAGNOSIS — K600 Acute anal fissure: Secondary | ICD-10-CM

## 2023-03-29 MED ORDER — SODIUM CHLORIDE 0.9 % IV SOLN: 500.0000 mL | Freq: Once | INTRAVENOUS | Status: DC

## 2023-03-29 NOTE — Progress Notes (Signed)
 Pt's states no medical or surgical changes since previsit or office visit.

## 2023-03-29 NOTE — Patient Instructions (Addendum)
-  Handout on polyps, diverticulosis hemorrhoids and high fiber diet provided -await pathology results -repeat colonoscopy for surveillance recommended. Date to be determined when pathology result become available   -Continue present medications    YOU HAD AN ENDOSCOPIC PROCEDURE TODAY AT THE [facility]:   Refer to the procedure report that was given to you for any specific questions about what was found during the examination.  If the procedure report does not answer your questions, please call your gastroenterologist to clarify.  If you requested that your care partner not be given the details of your procedure findings, then the procedure report has been included in a sealed envelope for you to review at your convenience later.  YOU SHOULD EXPECT: Some feelings of bloating in the abdomen. Passage of more gas than usual.  Walking can help get rid of the air that was put into your GI tract during the procedure and reduce the bloating. If you had a lower endoscopy (such as a colonoscopy or flexible sigmoidoscopy) you may notice spotting of blood in your stool or on the toilet paper. If you underwent a bowel prep for your procedure, you may not have a normal bowel movement for a few days.  Please Note:  You might notice some irritation and congestion in your nose or some drainage.  This is from the oxygen used during your procedure.  There is no need for concern and it should clear up in a day or so.  SYMPTOMS TO REPORT IMMEDIATELY:  Following lower endoscopy (colonoscopy or flexible sigmoidoscopy):  Excessive amounts of blood in the stool  Significant tenderness or worsening of abdominal pains  Swelling of the abdomen that is new, acute  Fever of 100F or higher  For urgent or emergent issues, a gastroenterologist can be reached at any hour by calling [phone]. Do not use MyChart messaging for urgent concerns.    DIET:  We do recommend a small meal at first, but then you may  proceed to a high fiber diet per doctors recommendation.  Drink plenty of fluids but you should avoid alcoholic beverages for 24 hours.  ACTIVITY:  You should plan to take it easy for the rest of today and you should NOT DRIVE or use heavy machinery until tomorrow (because of the sedation medicines used during the test).    FOLLOW UP: Our staff will call the number listed on your records the next business day following your procedure.  We will call around 7:15- 8:00 am to check on you and address any questions or concerns that you may have regarding the information given to you following your procedure. If we do not reach you, we will leave a message.     If any biopsies were taken you will be contacted by phone or by letter within the next 1-3 weeks.  Please call us at [phone] if you have not heard about the biopsies in 3 weeks.    SIGNATURES/CONFIDENTIALITY: You and/or your care partner have signed paperwork which will be entered into your electronic medical record.  These signatures attest to the fact that that the information above on your After Visit Summary has been reviewed and is understood.  Full responsibility of the confidentiality of this discharge information lies with you and/or your care-partner.

## 2023-03-29 NOTE — Progress Notes (Signed)
 Sedate, gd SR, tolerated procedure well, VSS, report to RN

## 2023-03-29 NOTE — Progress Notes (Signed)
 Called to room to assist during endoscopic procedure.  Patient ID and intended procedure confirmed with present staff. Received instructions for my participation in the procedure from the performing physician.

## 2023-03-29 NOTE — Progress Notes (Signed)
 [facility] History and Physical   Primary Care Physician:  Donell Beers, FNP   Reason for Procedure:   Follow up cecal diverticulitis  Plan:    Colonoscopy     HPI: Patrick Mills is a 53 y.o. male undergoing colonoscopy following a recent admission for cecal diverticulitis, treated with antibiotics.  His grandfather was diagnosed with colon cancer, otherwise no family history of colon cancer. He was recently diagnosed with an anal fissure.  He denies any chronic symptoms following the diverticulitis.   Past Medical History:  Diagnosis Date   Alcohol use 10/26/2022   Cecal diverticulitis 10/26/2022   Hypertension 10/28/2022    Past Surgical History:  Procedure Laterality Date   right inguinal hernia repair      Prior to Admission medications   Medication Sig Start Date End Date Taking? Authorizing Provider  AMBULATORY NON FORMULARY MEDICATION Medication Name: Diltiazem 2%/Lidocaine 2%   Using your index finger apply a small amount of medication inside the anal opening and to the external anal area three times daily x 6 weeks. 02/13/23  Yes Arnaldo Natal, NP  folic acid (FOLVITE) 1 MG tablet Take 1 tablet (1 mg total) by mouth daily. 10/29/22  Yes Mabe, Earvin Hansen, MD  Multiple Vitamin (MULTIVITAMIN WITH MINERALS) TABS tablet Take 1 tablet by mouth daily. 10/29/22  Yes Mabe, Earvin Hansen, MD  thiamine (VITAMIN B-1) 100 MG tablet Take 1 tablet (100 mg total) by mouth daily. 10/29/22  Yes Mabe, Earvin Hansen, MD  acetaminophen (TYLENOL) 325 MG tablet Take 2 tablets (650 mg total) by mouth every 6 (six) hours. Patient not taking: Reported on 03/29/2023 10/28/22   Evette Georges, MD    Current Outpatient Medications  Medication Sig Dispense Refill   AMBULATORY NON FORMULARY MEDICATION Medication Name: Diltiazem 2%/Lidocaine 2%   Using your index finger apply a small amount of medication inside the anal opening and to the external anal area three times daily x 6  weeks. 30 g 1   folic acid (FOLVITE) 1 MG tablet Take 1 tablet (1 mg total) by mouth daily.     Multiple Vitamin (MULTIVITAMIN WITH MINERALS) TABS tablet Take 1 tablet by mouth daily.     thiamine (VITAMIN B-1) 100 MG tablet Take 1 tablet (100 mg total) by mouth daily.     acetaminophen (TYLENOL) 325 MG tablet Take 2 tablets (650 mg total) by mouth every 6 (six) hours. (Patient not taking: Reported on 03/29/2023)     Current Facility-Administered Medications  Medication Dose Route Frequency Provider Last Rate Last Admin   0.9 %  sodium chloride infusion  500 mL Intravenous Once Jenel Lucks, MD        Allergies as of 03/29/2023 - Review Complete 03/29/2023  Allergen Reaction Noted   Meloxicam Hives 10/26/2022    Family History  Problem Relation Age of Onset   Arthritis Mother    Arthritis Father    Colon polyps Paternal Grandfather    Colon cancer Paternal Grandfather    Prostate cancer Neg Hx    Lung cancer Neg Hx    Esophageal cancer Neg Hx    Rectal cancer Neg Hx    Stomach cancer Neg Hx     Social History   Socioeconomic History   Marital status: Married    Spouse name: Not on file   Number of children: Not on file   Years of education: Not on file   Highest education level: Not on file  Occupational History   Not  on file  Tobacco Use   Smoking status: Never   Smokeless tobacco: Never  Vaping Use   Vaping status: Never Used  Substance and Sexual Activity   Alcohol use: Not Currently    Alcohol/week: 28.0 standard drinks of alcohol    Types: 28 Cans of beer per week    Comment: 2 per day beers as of 03/29/23, hardly any this week   Drug use: Yes    Types: Marijuana    Comment: sometimes   Sexual activity: Yes  Other Topics Concern   Not on file  Social History Narrative   Works in Holiday representative. Lives with his wife    Social Drivers of Corporate investment banker Strain: Not on file  Food Insecurity: No Food Insecurity (10/26/2022)   Hunger Vital  Sign    Worried About Running Out of Food in the Last Year: Never true    Ran Out of Food in the Last Year: Never true  Transportation Needs: No Transportation Needs (10/26/2022)   PRAPARE - Administrator, Civil Service (Medical): No    Lack of Transportation (Non-Medical): No  Physical Activity: Not on file  Stress: Not on file  Social Connections: Not on file  Intimate Partner Violence: Not At Risk (10/26/2022)   Humiliation, Afraid, Rape, and Kick questionnaire    Fear of Current or Ex-Partner: No    Emotionally Abused: No    Physically Abused: No    Sexually Abused: No    Review of Systems:  All other review of systems negative except as mentioned in the HPI.  Physical Exam: Vital signs BP 112/74   Pulse 66   Temp 97.9 F (36.6 C) (Temporal)   Ht 5\' 6"  (1.676 m)   Wt 143 lb (64.9 kg)   SpO2 100%   BMI 23.08 kg/m   General:   Alert,  Well-developed, well-nourished, pleasant and cooperative in NAD Airway:  Mallampati 1 Lungs:  Clear throughout to auscultation.   Heart:  Regular rate and rhythm; no murmurs, clicks, rubs,  or gallops. Abdomen:  Soft, nontender and nondistended. Normal bowel sounds.   Neuro/Psych:  Normal mood and affect. A and O x 3   Shaylie Eklund E. Tomasa Rand, MD [facility]

## 2023-03-29 NOTE — Op Note (Signed)
 [facility] Patient Name: Patrick Mills Procedure Date: 03/29/2023 9:38 AM MRN: [id number] Endoscopist: Lorin Picket E. Tomasa Rand , MD, [id number] Age: 53 Referring MD:  Date of Birth: October 09, 1970 Gender: Male Account #: [id number] Procedure:                Colonoscopy Indications:              Follow-up of diverticulitis Medicines:                Monitored Anesthesia Care Procedure:                Pre-Anesthesia Assessment:                           - Prior to the procedure, a History and Physical                            was performed, and patient medications and                            allergies were reviewed. The patient's tolerance of                            previous anesthesia was also reviewed. The risks                            and benefits of the procedure and the sedation                            options and risks were discussed with the patient.                            All questions were answered, and informed consent                            was obtained. Prior Anticoagulants: The patient has                            taken no anticoagulant or antiplatelet agents. ASA                            Grade Assessment: II - A patient with mild systemic                            disease. After reviewing the risks and benefits,                            the patient was deemed in satisfactory condition to                            undergo the procedure.                           After obtaining informed consent, the colonoscope  was passed under direct vision. Throughout the                            procedure, the patient's blood pressure, pulse, and                            oxygen saturations were monitored continuously. The                            CF [id number] #[id number] was introduced through the anus                            and advanced to the the terminal ileum, with                            identification of the  appendiceal orifice and IC                            valve. The colonoscopy was performed without                            difficulty. The patient tolerated the procedure                            well. The quality of the bowel preparation was                            good. The terminal ileum, ileocecal valve,                            appendiceal orifice, and rectum were photographed.                            The bowel preparation used was SUFLAVE via split                            dose instruction. Scope In: 9:47:37 AM Scope Out: 10:08:11 AM Scope Withdrawal Time: 0 hours 15 minutes 31 seconds  Total Procedure Duration: 0 hours 20 minutes 34 seconds  Findings:                 Sentinel skin tags were found on perianal exam.                           The digital rectal exam was normal. Pertinent                            negatives include normal sphincter tone and no                            palpable rectal lesions.                           A 12 mm polyp was found in the distal transverse  colon. The polyp was sessile. The polyp was removed                            with a cold snare. Resection and retrieval were                            complete. Estimated blood loss was minimal.                           A 2 mm polyp was found in the descending colon. The                            polyp was sessile. The polyp was removed with a                            cold snare. Resection and retrieval were complete.                            Estimated blood loss was minimal.                           A 5 mm polyp was found in the sigmoid colon. The                            polyp was semi-pedunculated. The polyp was removed                            with a cold snare. Resection and retrieval were                            complete. Estimated blood loss was minimal.                           Many medium-mouthed and small-mouthed diverticula                             were found in the sigmoid colon, descending colon,                            ascending colon and cecum. There was no evidence of                            diverticular bleeding.                           The exam was otherwise normal throughout the                            examined colon.                           The terminal ileum appeared normal.  Non-bleeding internal hemorrhoids were found during                            retroflexion. The hemorrhoids were Grade I                            (internal hemorrhoids that do not prolapse).                           No additional abnormalities were found on                            retroflexion. Complications:            No immediate complications. Estimated Blood Loss:     Estimated blood loss was minimal. Impression:               - Perianal skin tags found on perianal exam. The                            previously noted anal fissure has healed.                           - One 12 mm polyp in the distal transverse colon,                            removed with a cold snare. Resected and retrieved.                           - One 2 mm polyp in the descending colon, removed                            with a cold snare. Resected and retrieved.                           - One 5 mm polyp in the sigmoid colon, removed with                            a cold snare. Resected and retrieved.                           - Moderate diverticulosis in the sigmoid colon, in                            the descending colon, in the ascending colon and in                            the cecum. There was no evidence of diverticular                            bleeding.                           - The examined portion of the ileum was normal.                           -  Non-bleeding internal hemorrhoids. Recommendation:           - Patient has a contact number available for                            emergencies.  The signs and symptoms of potential                            delayed complications were discussed with the                            patient. Return to normal activities tomorrow.                            Written discharge instructions were provided to the                            patient.                           - Resume previous diet.                           - Continue present medications.                           - Await pathology results.                           - Repeat colonoscopy (date not yet determined) for                            surveillance based on pathology results.                           - Recommend high fiber diet/daily fiber supplement                            to reduce risk of recurrent diverticulitis. Rupert Azzara E. Tomasa Rand, MD 03/29/2023 10:16:09 AM This report has been signed electronically.

## 2023-04-01 ENCOUNTER — Telehealth: Payer: Self-pay

## 2023-04-01 NOTE — Telephone Encounter (Signed)
  Follow up Call-     03/29/2023    8:36 AM  Call back number  Post procedure Call Back phone  # [phone]  Permission to leave phone message No  comments vm full     Patient questions:  Do you have a fever, pain , or abdominal swelling? No. Pain Score  0 *  Have you tolerated food without any problems? Yes.    Have you been able to return to your normal activities? Yes.    Do you have any questions about your discharge instructions: Diet   No. Medications  No. Follow up visit  No.  Do you have questions or concerns about your Care? No.  Actions: * If pain score is 4 or above: No action needed, pain <4.

## 2023-04-02 LAB — SURGICAL PATHOLOGY

## 2023-04-08 ENCOUNTER — Encounter: Payer: Self-pay | Admitting: Gastroenterology

## 2023-04-08 NOTE — Progress Notes (Signed)
 Patrick Mills,   Two of the three polyps that I removed during your recent procedure were completely benign but were proven to be "pre-cancerous" polyps that MAY have grown into cancers if they had not been removed.  The third polyp was not precancerous.  Studies shows that at least 20% of women over age 53 and 30% of men over age 53 have pre-cancerous polyps.  Based on current nationally recognized surveillance guidelines, I recommend that you have a repeat colonoscopy in 3 years.   If you develop any new rectal bleeding, abdominal pain or significant bowel habit changes, please contact me before then.

## 2023-11-01 ENCOUNTER — Encounter: Payer: Self-pay | Admitting: Nurse Practitioner

## 2023-11-01 ENCOUNTER — Ambulatory Visit: Payer: Self-pay | Admitting: Nurse Practitioner

## 2023-11-01 VITALS — BP 135/90 | HR 77 | Ht 67.0 in | Wt 149.0 lb

## 2023-11-01 DIAGNOSIS — F1290 Cannabis use, unspecified, uncomplicated: Secondary | ICD-10-CM

## 2023-11-01 DIAGNOSIS — Z130 Encounter for screening for diseases of the blood and blood-forming organs and certain disorders involving the immune mechanism: Secondary | ICD-10-CM

## 2023-11-01 DIAGNOSIS — Z125 Encounter for screening for malignant neoplasm of prostate: Secondary | ICD-10-CM

## 2023-11-01 DIAGNOSIS — I10 Essential (primary) hypertension: Secondary | ICD-10-CM

## 2023-11-01 DIAGNOSIS — G8929 Other chronic pain: Secondary | ICD-10-CM

## 2023-11-01 DIAGNOSIS — Z13228 Encounter for screening for other metabolic disorders: Secondary | ICD-10-CM

## 2023-11-01 DIAGNOSIS — Z1329 Encounter for screening for other suspected endocrine disorder: Secondary | ICD-10-CM

## 2023-11-01 DIAGNOSIS — F1090 Alcohol use, unspecified, uncomplicated: Secondary | ICD-10-CM

## 2023-11-01 DIAGNOSIS — Z0000 Encounter for general adult medical examination without abnormal findings: Secondary | ICD-10-CM

## 2023-11-01 DIAGNOSIS — Z1321 Encounter for screening for nutritional disorder: Secondary | ICD-10-CM

## 2023-11-01 DIAGNOSIS — M25511 Pain in right shoulder: Secondary | ICD-10-CM

## 2023-11-01 NOTE — Progress Notes (Signed)
 Complete physical exam  Patient: Patrick Mills   DOB: 1970-11-08   53 y.o. Male  MRN: [id number]  Subjective:    Chief Complaint  Patient presents with   Annual Exam    Not fasting      Discussed the use of AI scribe software for clinical note transcription with the patient, who gave verbal consent to proceed.  History of Present Illness Patrick Mills is a 53 year old male  has a past medical history of Alcohol use (10/26/2022), Allergy (2017), Arthritis, Cecal diverticulitis (10/26/2022), GERD (gastroesophageal reflux disease), and Hypertension (10/28/2022).  who presents for an annual physical exam.  He has been experiencing significant shoulder pain for the past six weeks, described as dull and exacerbated by movement, particularly at night. The pain originated from an injury six or seven years ago. He has not had any fractures or surgeries related to this injury and suspects it may be muscular or tendon-related. Ibuprofen has been used intermittently with minimal relief.  His blood pressure readings during the visit were elevated, with initial readings of 144/96 and a repeat of 135/90. He notes that his blood pressure was lower during a previous visit in March, recorded at 113/79. He does not take any regular medications except for a multivitamin.  He has a history of diverticulitis and rectal bleeding, which was previously evaluated by a gastroenterologist earlier this year. He reports no further bleeding since that evaluation.  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 He lives with his wife and has no children. He occasionally smokes marijuana and drinks alcohol, but not daily. He does not smoke cigarettes or vape.  No fever, chills, chest pain, shortness of breath, abdominal pain, nausea, vomiting, or hearing problems. He notes some wax buildup in his ears and reports some vision changes, attributing them to aging.    Assessment & Plan     Most recent fall risk assessment:     No data to display           Most recent depression screenings:    11/01/2023    8:09 AM 10/31/2022    2:45 PM  PHQ 2/9 Scores  PHQ - 2 Score 0 0  PHQ- 9 Score 0         Patient Care Team: Dantavious Snowball R, FNP as PCP - General (Nurse Practitioner)   Outpatient Medications Prior to Visit  Medication Sig   Multiple Vitamin (MULTIVITAMIN WITH MINERALS) TABS tablet Take 1 tablet by mouth daily.   acetaminophen  (TYLENOL ) 325 MG tablet Take 2 tablets (650 mg total) by mouth every 6 (six) hours. (Patient not taking: Reported on 11/01/2023)   AMBULATORY NON FORMULARY MEDICATION Medication Name: Diltiazem 2%/Lidocaine 2%   Using your index finger apply a small amount of medication inside the anal opening and to the external anal area three times daily x 6 weeks. (Patient not taking: Reported on 11/01/2023)   folic acid  (FOLVITE ) 1 MG tablet Take 1 tablet (1 mg total) by mouth daily. (Patient not taking: Reported on 11/01/2023)   thiamine  (VITAMIN B-1) 100 MG tablet Take 1 tablet (100 mg total) by mouth daily. (Patient not taking: Reported on 11/01/2023)   No facility-administered medications prior to visit.    Review of Systems  Constitutional:  Negative for appetite change, chills, fatigue and fever.  HENT:  Negative for congestion, postnasal drip, rhinorrhea and sneezing.   Eyes:  Negative  for pain, discharge and itching.  Respiratory:  Negative for cough, shortness of breath and wheezing.   Cardiovascular:   Negative for chest pain, palpitations and leg swelling.  Gastrointestinal:  Negative for abdominal pain, constipation, nausea and vomiting.  Endocrine: Negative for cold intolerance, heat intolerance and polydipsia.  Genitourinary:  Negative for difficulty urinating, dysuria, flank pain and frequency.  Musculoskeletal:  Positive for arthralgias. Negative for back pain, joint swelling and myalgias.  Skin:  Negative for color change, pallor, rash and wound.  Neurological:  Negative for dizziness, facial asymmetry, weakness, numbness and headaches.  Psychiatric/Behavioral:  Negative for behavioral problems, confusion, self-injury and suicidal ideas.        Objective:     BP (!) 135/90   Pulse 77   Ht 5' 7 (1.702 m)   Wt 149 lb (67.6 kg)   SpO2 100%   BMI 23.34 kg/m    Physical Exam Vitals and nursing note reviewed.  Constitutional:      General: He is not in acute distress.    Appearance: Normal appearance. He is not ill-appearing, toxic-appearing or diaphoretic.  HENT:     Right Ear: Tympanic membrane, ear canal and external ear normal. There is no impacted cerumen.     Left Ear: Tympanic membrane, ear canal and external ear normal. There is no impacted cerumen.     Nose: Nose normal. No congestion or rhinorrhea.     Mouth/Throat:     Mouth: Mucous membranes are moist.     Pharynx: Oropharynx is clear. No oropharyngeal exudate or posterior oropharyngeal erythema.  Eyes:     General: No scleral icterus.       Right eye: No discharge.        Left eye: No discharge.     Extraocular Movements: Extraocular movements intact.     Conjunctiva/sclera: Conjunctivae normal.  Neck:     Vascular: No carotid bruit.  Cardiovascular:     Rate and Rhythm: Normal rate and regular rhythm.     Pulses: Normal pulses.     Heart sounds: Normal heart sounds. No murmur heard.    No friction rub. No gallop.  Pulmonary:     Effort: Pulmonary effort is normal. No respiratory distress.     Breath  sounds: Normal breath sounds. No stridor. No wheezing, rhonchi or rales.  Chest:     Chest wall: No tenderness.  Abdominal:     General: Bowel sounds are normal. There is no distension.     Palpations: Abdomen is soft. There is no mass.     Tenderness: There is no abdominal tenderness. There is no right CVA tenderness, left CVA tenderness, guarding or rebound.     Hernia: No hernia is present.  Musculoskeletal:        General: Tenderness present. No swelling, deformity or signs of injury.     Cervical back: Normal range of motion and neck supple. No rigidity or tenderness.     Right lower leg: No edema.     Left lower leg: No edema.     Comments: Tenderness on range of motion of right shoulder, skin is warm and dry no redness or swelling noted  Lymphadenopathy:     Cervical: No cervical adenopathy.  Skin:    General: Skin is warm and dry.     Capillary Refill: Capillary refill takes less than 2 seconds.     Coloration: Skin is not jaundiced or pale.     Findings: No bruising, erythema, lesion or rash.  Neurological:     Mental Status: He is alert and oriented to person, place, and time.     Cranial Nerves: No cranial nerve deficit.     Sensory: No sensory deficit.     Motor: No weakness.     Coordination: Coordination normal.     Gait: Gait normal.     Deep Tendon Reflexes: Reflexes normal.  Psychiatric:        Mood and Affect: Mood normal.        Behavior: Behavior normal.        Thought Content: Thought content normal.        Judgment: Judgment normal.     No results found for any visits on 11/01/23.     Assessment & Plan:    Routine Health Maintenance and Physical Exam  Immunization History  Administered Date(s) Administered   PFIZER(Purple Top)SARS-COV-2 Vaccination 06/20/2019    Health Maintenance  Topic Date Due   DTaP/Tdap/Td (1 - Tdap) Never done   Hepatitis B Vaccines 19-59 Average Risk (1 of 3 - 19+ 3-dose series) Never done   Pneumococcal Vaccine: 50+  Years (1 of 1 - PCV) Never done   Zoster Vaccines- Shingrix (1 of 2) Never done   COVID-19 Vaccine (2 - 2025-26 season) 09/09/2023   Influenza Vaccine  04/07/2024 (Originally 08/09/2023)   Colonoscopy  03/29/2026   Hepatitis C Screening  Completed   HIV Screening  Completed   HPV VACCINES  Aged Out   Meningococcal B Vaccine  Aged Out    Discussed health benefits of physical activity, and encouraged him to engage in regular exercise appropriate for his age and condition.  Problem List Items Addressed This Visit       Cardiovascular and Mediastinum   Hypertension   Blood pressure elevated at 135/90 and 144/96. Previous reading was 113/79. Possible factors: shoulder pain, coffee. Discussed home monitoring. - Recheck blood pressure in 1-2 months. - Start home blood pressure monitoring, at least three times a week. - Consider antihypertensive medication if blood pressure remains elevated.         Other   Alcohol use   Alcohol use, mild Reports occasional alcohol consumption, not daily. - Advise limiting alcohol intake to no more than two drinks per day.        Marijuana user    Occasional marijuana use reported. - Encourage cessation of marijuana use.      Annual physical exam - Primary   Annual physical examination conducted. Discussed importance of activity, diet, hydration, sleep, seatbelt use, and vaccinations. - Schedule fasting labs next week. - Encourage regular physical activity: 30 minutes, 5 days a week. - Recommend a heart-healthy, low-salt, low-fat diet. - Advise drinking at least 64 ounces of water daily. - Recommend 7-8 hours of sleep nightly. - Encourage use of seatbelt. - Discussed flu, pneumonia, and tetanus vaccines.       Chronic right shoulder pain   Chronic pain for 6-7 weeks, likely muscular or tendon-related. Ibuprofen provides minimal relief. - Refer to orthopedic specialist for further evaluation. - Avoid ibuprofen due to diverticulitis. -  Recommend acetaminophen  650 mg every 6 hours as needed.       Relevant Orders   Ambulatory referral to Orthopedic Surgery   Other Visit Diagnoses       Screening for prostate cancer       Relevant Orders   PSA     Screening for endocrine, nutritional, metabolic and immunity disorder       Relevant  Orders   CBC   CMP14+EGFR   Lipid panel      Return in about 2 months (around 01/01/2024) for HTN, FASTING LABS next WEEK.     Mathea Frieling R Kissy Cielo, FNP

## 2023-11-01 NOTE — Assessment & Plan Note (Signed)
 Annual physical examination conducted. Discussed importance of activity, diet, hydration, sleep, seatbelt use, and vaccinations. - Schedule fasting labs next week. - Encourage regular physical activity: 30 minutes, 5 days a week. - Recommend a heart-healthy, low-salt, low-fat diet. - Advise drinking at least 64 ounces of water daily. - Recommend 7-8 hours of sleep nightly. - Encourage use of seatbelt. - Discussed flu, pneumonia, and tetanus vaccines.

## 2023-11-01 NOTE — Assessment & Plan Note (Signed)
 Blood pressure elevated at 135/90 and 144/96. Previous reading was 113/79. Possible factors: shoulder pain, coffee. Discussed home monitoring. - Recheck blood pressure in 1-2 months. - Start home blood pressure monitoring, at least three times a week. - Consider antihypertensive medication if blood pressure remains elevated.

## 2023-11-01 NOTE — Assessment & Plan Note (Signed)
  Occasional marijuana use reported. - Encourage cessation of marijuana use.

## 2023-11-01 NOTE — Assessment & Plan Note (Addendum)
 Chronic pain for 6-7 weeks, likely muscular or tendon-related. Ibuprofen provides minimal relief. - Refer to orthopedic specialist for further evaluation. - Avoid ibuprofen due to diverticulitis. - Recommend acetaminophen  650 mg every 6 hours as needed.

## 2023-11-01 NOTE — Assessment & Plan Note (Signed)
 Alcohol use, mild Reports occasional alcohol consumption, not daily. - Advise limiting alcohol intake to no more than two drinks per day.

## 2023-11-01 NOTE — Patient Instructions (Signed)
 OK to use Debrox (peroxide) in the ear to loosen up wax.  Do not use Q-tips as this can impact wax further.   Around 3 times per week, check your blood pressure 2 times per day. once in the morning and once in the evening. The readings should be at least one minute apart. Write down these values and bring them to your next nurse visit/appointment.  When you check your BP, make sure you have been doing something calm/relaxing 5 minutes prior to checking. Both feet should be flat on the floor and you should be sitting. Use your left arm and make sure it is in a relaxed position (on a table), and that the cuff is at the approximate level/height of your heart.  Blood pressure goal is less than 140/90   It is important that you exercise regularly at least 30 minutes 5 times a week as tolerated  Think about what you will eat, plan ahead. Choose  clean, green, fresh or frozen over canned, processed or packaged foods which are more sugary, salty and fatty. 70 to 75% of food eaten should be vegetables and fruit. Three meals at set times with snacks allowed between meals, but they must be fruit or vegetables. Aim to eat over a 12 hour period , example 7 am to 7 pm, and STOP after  your last meal of the day. Drink water,generally about 64 ounces per day, no other drink is as healthy. Fruit juice is best enjoyed in a healthy way, by EATING the fruit.  Thanks for choosing [facility] we consider it a privelige to serve you.

## 2023-11-04 ENCOUNTER — Other Ambulatory Visit: Payer: Self-pay

## 2023-11-04 DIAGNOSIS — Z130 Encounter for screening for diseases of the blood and blood-forming organs and certain disorders involving the immune mechanism: Secondary | ICD-10-CM

## 2023-11-04 DIAGNOSIS — Z125 Encounter for screening for malignant neoplasm of prostate: Secondary | ICD-10-CM

## 2023-11-05 ENCOUNTER — Ambulatory Visit: Payer: Self-pay | Admitting: Nurse Practitioner

## 2023-11-05 LAB — CBC
Hematocrit: 49.1 % (ref 37.5–51.0)
Hemoglobin: 16.7 g/dL (ref 13.0–17.7)
MCH: 34.6 pg — ABNORMAL HIGH (ref 26.6–33.0)
MCHC: 34 g/dL (ref 31.5–35.7)
MCV: 102 fL — ABNORMAL HIGH (ref 79–97)
Platelets: 232 x10E3/uL (ref 150–450)
RBC: 4.82 x10E6/uL (ref 4.14–5.80)
RDW: 11.8 % (ref 11.6–15.4)
WBC: 6.3 x10E3/uL (ref 3.4–10.8)

## 2023-11-05 LAB — CMP14+EGFR
ALT: 53 IU/L — ABNORMAL HIGH (ref 0–44)
AST: 55 IU/L — ABNORMAL HIGH (ref 0–40)
Albumin: 5 g/dL — ABNORMAL HIGH (ref 3.8–4.9)
Alkaline Phosphatase: 73 IU/L (ref 47–123)
BUN/Creatinine Ratio: 14 (ref 9–20)
BUN: 16 mg/dL (ref 6–24)
Bilirubin Total: 1 mg/dL (ref 0.0–1.2)
CO2: 22 mmol/L (ref 20–29)
Calcium: 10.2 mg/dL (ref 8.7–10.2)
Chloride: 102 mmol/L (ref 96–106)
Creatinine, Ser: 1.11 mg/dL (ref 0.76–1.27)
Globulin, Total: 2.7 g/dL (ref 1.5–4.5)
Glucose: 98 mg/dL (ref 70–99)
Potassium: 4.6 mmol/L (ref 3.5–5.2)
Sodium: 141 mmol/L (ref 134–144)
Total Protein: 7.7 g/dL (ref 6.0–8.5)
eGFR: 79 mL/min/1.73 (ref >59)

## 2023-11-05 LAB — PSA: Prostate Specific Ag, Serum: 1 ng/mL (ref 0.0–4.0)

## 2023-11-05 LAB — LIPID PANEL
Chol/HDL Ratio: 3.5 ratio (ref 0.0–5.0)
Cholesterol, Total: 264 mg/dL — ABNORMAL HIGH (ref 100–199)
HDL: 76 mg/dL (ref >39)
LDL Chol Calc (NIH): 162 mg/dL — ABNORMAL HIGH (ref 0–99)
Triglycerides: 147 mg/dL (ref 0–149)
VLDL Cholesterol Cal: 26 mg/dL (ref 5–40)

## 2023-12-25 ENCOUNTER — Ambulatory Visit: Payer: Self-pay | Admitting: Nurse Practitioner

## 2024-01-17 ENCOUNTER — Ambulatory Visit: Payer: Self-pay | Admitting: Nurse Practitioner

## 2024-02-10 ENCOUNTER — Ambulatory Visit: Payer: Self-pay | Admitting: Nurse Practitioner

## 2024-03-06 ENCOUNTER — Ambulatory Visit: Payer: Self-pay | Admitting: Nurse Practitioner
